# Patient Record
Sex: Male | Born: 1950
Health system: Southern US, Community
[De-identification: ages and names within clinical notes are randomized; demographics above are authoritative.]

## PROBLEM LIST (undated history)

## (undated) DIAGNOSIS — I499 Cardiac arrhythmia, unspecified: Secondary | ICD-10-CM

## (undated) DIAGNOSIS — E785 Hyperlipidemia, unspecified: Secondary | ICD-10-CM

## (undated) DIAGNOSIS — C61 Malignant neoplasm of prostate: Secondary | ICD-10-CM

## (undated) DIAGNOSIS — I10 Essential (primary) hypertension: Secondary | ICD-10-CM

## (undated) DIAGNOSIS — T7840XA Allergy, unspecified, initial encounter: Secondary | ICD-10-CM

## (undated) HISTORY — DX: Hyperlipidemia, unspecified: E78.5

## (undated) HISTORY — DX: Allergy, unspecified, initial encounter: T78.40XA

## (undated) HISTORY — DX: Essential (primary) hypertension: I10

---

## 2009-07-11 HISTORY — PX: NM MYOCAR PERF WALL MOTION: HXRAD629

## 2012-12-10 ENCOUNTER — Telehealth: Payer: Self-pay | Admitting: Internal Medicine

## 2012-12-10 MED ORDER — HYDRALAZINE HCL 50 MG PO TABS: 50.0000 mg | ORAL_TABLET | Freq: Three times a day (TID) | ORAL | Status: DC

## 2012-12-10 NOTE — Telephone Encounter (Signed)
Have not been able to get his Hydralazine 50mg #90-Call to [facility] have tried to get this-told pt to call us!Need this asap-please call today if possible-He is totally out of it-Pt wife says he must have his medicine tonight!

## 2012-12-10 NOTE — Telephone Encounter (Signed)
Refill(s) sent to pharmacy.  Call to pt and spoke w/ wife [name] and informed.  Verbalized understanding.

## 2012-12-12 ENCOUNTER — Other Ambulatory Visit: Payer: Self-pay | Admitting: Internal Medicine

## 2012-12-18 ENCOUNTER — Other Ambulatory Visit: Payer: Self-pay | Admitting: Internal Medicine

## 2013-05-01 ENCOUNTER — Other Ambulatory Visit: Payer: Self-pay | Admitting: Internal Medicine

## 2013-05-01 NOTE — Telephone Encounter (Signed)
Rx was sent to pharmacy electronically. 

## 2013-06-12 ENCOUNTER — Other Ambulatory Visit: Payer: Self-pay | Admitting: *Deleted

## 2013-06-12 MED ORDER — NEBIVOLOL HCL 10 MG PO TABS: 10.0000 mg | ORAL_TABLET | Freq: Two times a day (BID) | ORAL | Status: DC

## 2013-07-27 ENCOUNTER — Other Ambulatory Visit: Payer: Self-pay | Admitting: Internal Medicine

## 2013-07-29 ENCOUNTER — Other Ambulatory Visit: Payer: Self-pay | Admitting: *Deleted

## 2013-07-29 MED ORDER — NEBIVOLOL HCL 10 MG PO TABS: 20.0000 mg | ORAL_TABLET | Freq: Two times a day (BID) | ORAL | Status: DC

## 2013-09-28 ENCOUNTER — Other Ambulatory Visit: Payer: Self-pay | Admitting: Internal Medicine

## 2013-09-29 ENCOUNTER — Emergency Department (HOSPITAL_COMMUNITY)
Admission: EM | Admit: 2013-09-29 | Discharge: 2013-09-29 | Discharge status: Absent without leave | Payer: BC Managed Care – PPO | Source: Home / Self Care

## 2013-09-30 NOTE — Telephone Encounter (Signed)
Rx was sent to pharmacy electronically. 

## 2013-10-17 ENCOUNTER — Encounter: Payer: Self-pay | Admitting: *Deleted

## 2013-10-18 ENCOUNTER — Ambulatory Visit: Payer: BC Managed Care – PPO | Admitting: Internal Medicine

## 2013-10-28 ENCOUNTER — Other Ambulatory Visit: Payer: Self-pay | Admitting: Internal Medicine

## 2013-10-28 NOTE — Telephone Encounter (Signed)
Rx was sent to pharmacy electronically. 

## 2013-11-26 ENCOUNTER — Encounter: Payer: Self-pay | Admitting: Internal Medicine

## 2013-11-26 ENCOUNTER — Ambulatory Visit: Payer: BC Managed Care – PPO | Admitting: Internal Medicine

## 2013-12-09 ENCOUNTER — Other Ambulatory Visit: Payer: Self-pay | Admitting: Internal Medicine

## 2013-12-09 NOTE — Telephone Encounter (Signed)
Rx was sent to pharmacy electronically. Patient must keep appointment on 12/30/13 for future refills.

## 2013-12-24 ENCOUNTER — Other Ambulatory Visit: Payer: Self-pay | Admitting: Internal Medicine

## 2013-12-24 NOTE — Telephone Encounter (Signed)
Rx was sent to pharmacy electronically. Patient must keep appointment on 12/30/13 with Dr Jayme Cloud for future refills.

## 2013-12-30 ENCOUNTER — Encounter: Payer: Self-pay | Admitting: Internal Medicine

## 2013-12-30 ENCOUNTER — Ambulatory Visit (INDEPENDENT_AMBULATORY_CARE_PROVIDER_SITE_OTHER): Payer: BC Managed Care – PPO | Admitting: Internal Medicine

## 2013-12-30 VITALS — BP 174/98 | HR 71 | Ht 71.0 in | Wt 212.5 lb

## 2013-12-30 DIAGNOSIS — E785 Hyperlipidemia, unspecified: Secondary | ICD-10-CM

## 2013-12-30 DIAGNOSIS — I10 Essential (primary) hypertension: Secondary | ICD-10-CM

## 2013-12-30 NOTE — Patient Instructions (Signed)
No changes were made today in your current therapy.  Dr Debara Pickett wants you to follow-up in 1 year. You will receive a reminder letter in the mail one months in advance. If you don't receive a letter, please call our office to schedule the follow-up appointment.

## 2013-12-31 ENCOUNTER — Other Ambulatory Visit: Payer: Self-pay | Admitting: Internal Medicine

## 2013-12-31 NOTE — Telephone Encounter (Signed)
Rx was sent to pharmacy electronically. 

## 2014-01-09 ENCOUNTER — Encounter: Payer: Self-pay | Admitting: Internal Medicine

## 2014-01-09 DIAGNOSIS — I10 Essential (primary) hypertension: Secondary | ICD-10-CM | POA: Insufficient documentation

## 2014-01-09 DIAGNOSIS — E785 Hyperlipidemia, unspecified: Secondary | ICD-10-CM | POA: Insufficient documentation

## 2014-01-09 NOTE — Progress Notes (Signed)
OFFICE NOTE  Chief Complaint:  No complaints  Primary Care Physician: Trevor Pao, MD  HPI:  Trevor Trevor [name]  is a pleasant 63 year old gentleman with history of resistant hypertension, on numerous medications including clonidine, Tribenzor, hydralazine, and Bystolic. Blood pressures at home seem to be well controlled; however, he does have additional white coat syndrome. Today in the office, his blood pressure was 180/110 and did not come down after another recheck toward the end of the visit. Blood pressures at home, per a diary that he has kept, range between the 120s and 130s up to 160s, with an average blood pressure between 416 and 606 systolic and 80 to 90 diastolic. Overall, I think this is pretty good control. He is asymptomatic. He denies any chest pain, worsening shortness of breath, palpitations, presyncope, or syncopal symptoms. He tells me that he had a recent physical with you and had a lipid profile checked, which was within normal limits. Recent laboratory work was received from your office which demonstrates total cholesterol 128, triglycerides 158, HDL 40 and LDL 56.  Hemoglobin A1c was 5.6.  PMHx:  Past Medical History  Diagnosis Date  . Hypertension   . Dyslipidemia     Past Surgical History  Procedure Laterality Date  . Nm myocar perf wall motion  2011    persantine myoview - normal perfusion, EF 64%, low risk scan    FAMHx:  Family History  Problem Relation Age of Onset  . Breast cancer Mother   . Heart disease Father   . Prostate cancer Father   . Heart disease Maternal Grandmother   . Heart disease Maternal Grandfather   . Heart attack Paternal Grandmother   . Lupus Paternal Grandfather   . Heart disease Brother     46's    SOCHx:   reports that he has never smoked. He does not have any smokeless tobacco history on file. His alcohol and drug histories are not on file.  ALLERGIES:  No Known Allergies  ROS: A comprehensive review of  systems was negative.  HOME MEDS: Current Outpatient Prescriptions  Medication Sig Dispense Refill  . aspirin 81 MG tablet Take 81 mg by mouth daily.      Marland Kitchen atorvastatin (LIPITOR) 10 MG tablet Take 10 mg by mouth daily.      . cloNIDine (CATAPRES) 0.2 MG tablet take 1 tablet by mouth twice a day  60 tablet  0  . hydrALAZINE (APRESOLINE) 50 MG tablet take 1 tablet by mouth three times a day  90 tablet  0  . irbesartan-hydrochlorothiazide (AVALIDE) 300-12.5 MG per tablet take 1 tablet by mouth once daily  30 tablet  0  . Multiple Vitamin (MULTIVITAMIN) capsule Take 1 capsule by mouth daily.      . nebivolol (BYSTOLIC) 10 MG tablet Take 2 tablets (20 mg total) by mouth 2 (two) times daily.  120 tablet  3  . amLODipine (NORVASC) 10 MG tablet take 1 tablet by mouth once daily  30 tablet  11   No current facility-administered medications for this visit.    LABS/IMAGING: No results found for this or any previous visit (from the past 48 hour(s)). No results found.  VITALS: BP 174/98  Pulse 71  Ht 5\' 11"  (1.803 m)  Wt 212 lb 8 oz (96.389 kg)  BMI 29.65 kg/m2  EXAM: General appearance: alert and no distress Neck: no carotid bruit and no JVD Lungs: clear to auscultation bilaterally Heart: regular rate and rhythm, S1, S2 normal,  no murmur, click, rub or gallop Abdomen: soft, non-tender; bowel sounds normal; no masses,  no organomegaly Extremities: extremities normal, atraumatic, no cyanosis or edema Pulses: 2+ and symmetric Skin: Skin color, texture, turgor normal. No rashes or lesions Neurologic: Grossly normal Psych: Mood, affect normal  EKG: Normal sinus rhythm at 71  ASSESSMENT: 1. Hypertension-controlled 2. Dyslipidemia-at goal  PLAN: 1.   Trevor Trevor [name] is doing well. His blood pressure and cholesterol are at goal. I would not make any changes in his medications at this time. Plan to see him back annually or sooner as necessary.  Trevor Casino, MD, [facility] Attending  Cardiologist [facility]  Trevor [name],Trevor Trevor [name] 01/09/2014, 5:25 PM

## 2014-01-26 ENCOUNTER — Other Ambulatory Visit: Payer: Self-pay | Admitting: Internal Medicine

## 2014-02-05 ENCOUNTER — Other Ambulatory Visit: Payer: Self-pay | Admitting: Internal Medicine

## 2014-02-05 NOTE — Telephone Encounter (Signed)
Rx was sent to pharmacy electronically. 

## 2014-06-23 ENCOUNTER — Other Ambulatory Visit: Payer: Self-pay | Admitting: Internal Medicine

## 2014-06-23 NOTE — Telephone Encounter (Signed)
Rx was sent to pharmacy electronically. 

## 2014-08-24 ENCOUNTER — Other Ambulatory Visit: Payer: Self-pay | Admitting: Internal Medicine

## 2014-08-25 NOTE — Telephone Encounter (Signed)
Rx(s) sent to pharmacy electronically.  

## 2014-09-24 ENCOUNTER — Telehealth: Payer: Self-pay | Admitting: *Deleted

## 2014-09-24 NOTE — Telephone Encounter (Signed)
Faxed PA/quantity exception to [employer] for Bystolic 10mg  - take 2 tablets BID

## 2014-09-30 ENCOUNTER — Telehealth: Payer: Self-pay | Admitting: Internal Medicine

## 2014-09-30 ENCOUNTER — Other Ambulatory Visit: Payer: Self-pay | Admitting: *Deleted

## 2014-09-30 MED ORDER — NEBIVOLOL HCL 20 MG PO TABS: 20.0000 mg | ORAL_TABLET | Freq: Two times a day (BID) | ORAL | Status: DC

## 2014-09-30 NOTE — Telephone Encounter (Signed)
Bystolic 10mg  - take 2 tablets by mouth BID - not approved per pharmacy. They received a notification of denial on 09/25/14  Was advised to send in Rx for bystolic 20mg  - take 1 tablet BID - V/O for this strength of tablet and updated instructions given #60 with 3 refills Pharmacy staff will fax PA notification if it is generated.   LM for patient to return call to update him on situation

## 2014-10-01 NOTE — Telephone Encounter (Signed)
Close encounter 

## 2014-10-02 NOTE — Telephone Encounter (Signed)
Patient notified that Rx was changed to 20mg  tablets - take 2 BID - and this was approved. He states he got a call from pharmacy about 2 hours after he dropped off paperwork r/t PA on Tuesday that bystolic 20mg  was approved. He thanked Therapist, sports for assistance with this matter.

## 2014-10-09 ENCOUNTER — Encounter: Payer: Self-pay | Admitting: Gastroenterology

## 2014-11-05 ENCOUNTER — Other Ambulatory Visit: Payer: Self-pay

## 2014-11-05 MED ORDER — NEBIVOLOL HCL 20 MG PO TABS: 20.0000 mg | ORAL_TABLET | Freq: Two times a day (BID) | ORAL | Status: DC

## 2014-11-05 NOTE — Telephone Encounter (Signed)
Rx(s) sent to pharmacy electronically.  

## 2014-11-06 ENCOUNTER — Telehealth: Payer: Self-pay | Admitting: *Deleted

## 2014-11-06 NOTE — Telephone Encounter (Signed)
No show letter mailed to patient and colonoscopy cancelled.

## 2014-11-06 NOTE — Telephone Encounter (Signed)
Patient no show for previsit appointment 11/06/14. Message left on cell phone as well as with wife to reschedule previst appointment by 5 pm today or colonoscopy would be cancelled 11/20/14 and both appointments would need to be rescheduled.

## 2014-11-20 ENCOUNTER — Encounter: Payer: Self-pay | Admitting: Gastroenterology

## 2014-12-25 ENCOUNTER — Other Ambulatory Visit: Payer: Self-pay | Admitting: Internal Medicine

## 2014-12-25 NOTE — Telephone Encounter (Signed)
Rx(s) sent to pharmacy electronically.  

## 2015-01-05 ENCOUNTER — Ambulatory Visit (INDEPENDENT_AMBULATORY_CARE_PROVIDER_SITE_OTHER): Payer: Managed Care, Other (non HMO) | Admitting: Internal Medicine

## 2015-01-05 ENCOUNTER — Encounter: Payer: Self-pay | Admitting: Internal Medicine

## 2015-01-05 DIAGNOSIS — N390 Urinary tract infection, site not specified: Secondary | ICD-10-CM

## 2015-01-05 DIAGNOSIS — I10 Essential (primary) hypertension: Secondary | ICD-10-CM

## 2015-01-05 DIAGNOSIS — E785 Hyperlipidemia, unspecified: Secondary | ICD-10-CM

## 2015-01-05 MED ORDER — HYDRALAZINE HCL 50 MG PO TABS: 50.0000 mg | ORAL_TABLET | Freq: Three times a day (TID) | ORAL | Status: DC

## 2015-01-05 MED ORDER — CLONIDINE HCL 0.2 MG PO TABS: 0.2000 mg | ORAL_TABLET | Freq: Two times a day (BID) | ORAL | Status: DC

## 2015-01-05 MED ORDER — AMLODIPINE BESYLATE 10 MG PO TABS: 10.0000 mg | ORAL_TABLET | Freq: Every day | ORAL | Status: DC

## 2015-01-05 MED ORDER — IRBESARTAN-HYDROCHLOROTHIAZIDE 300-12.5 MG PO TABS: 1.0000 | ORAL_TABLET | Freq: Every day | ORAL | Status: DC

## 2015-01-05 NOTE — Patient Instructions (Signed)
Your physician wants you to follow-up in: 1 year with Dr. Hilty. You will receive a reminder letter in the mail two months in advance. If you don't receive a letter, please call our office to schedule the follow-up appointment.  

## 2015-01-07 DIAGNOSIS — N390 Urinary tract infection, site not specified: Secondary | ICD-10-CM | POA: Insufficient documentation

## 2015-01-07 NOTE — Progress Notes (Signed)
OFFICE NOTE  Chief Complaint:  No complaints  Primary Care Physician: Haywood Pao, MD  HPI:  Trevor [name]  is a pleasant 64 year old gentleman with history of resistant hypertension, on numerous medications including clonidine, Tribenzor, hydralazine, and Bystolic. Blood pressures at home seem to be well controlled; however, he does have additional white coat syndrome. Today in the office, his blood pressure was 180/110 and did not come down after another recheck toward the end of the visit. Blood pressures at home, per a diary that he has kept, range between the 120s and 130s up to 160s, with an average blood pressure between 371 and 696 systolic and 80 to 90 diastolic. Overall, I think this is pretty good control. He is asymptomatic. He denies any chest pain, worsening shortness of breath, palpitations, presyncope, or syncopal symptoms. He tells me that he had a recent physical with you and had a lipid profile checked, which was within normal limits. Recent laboratory work was received from your office which demonstrates total cholesterol 128, triglycerides 158, HDL 40 and LDL 56.  Hemoglobin A1c was 5.6.  Some Trevor [name] her back in the office today. He is currently without any complaints. He recently had a urinary tract infection and is on anabolic for this. He denies any chest pain or worsening shortness of breath. Blood pressure has been well controlled. He's had no side effects from his cholesterol medicines.  PMHx:  Past Medical History  Diagnosis Date  . Hypertension   . Dyslipidemia     Past Surgical History  Procedure Laterality Date  . Nm myocar perf wall motion  2011    persantine myoview - normal perfusion, EF 64%, low risk scan    FAMHx:  Family History  Problem Relation Age of Onset  . Breast cancer Mother   . Heart disease Father   . Prostate cancer Father   . Heart disease Maternal Grandmother   . Heart disease Maternal Grandfather   . Heart attack  Paternal Grandmother   . Lupus Paternal Grandfather   . Heart disease Brother     71's    SOCHx:   reports that he has never smoked. He does not have any smokeless tobacco history on file. His alcohol and drug histories are not on file.  ALLERGIES:  No Known Allergies  ROS: A comprehensive review of systems was negative.  HOME MEDS: Current Outpatient Prescriptions  Medication Sig Dispense Refill  . amLODipine (NORVASC) 10 MG tablet Take 1 tablet (10 mg total) by mouth daily. 30 tablet 11  . aspirin 81 MG tablet Take 81 mg by mouth daily.    Marland Kitchen atorvastatin (LIPITOR) 10 MG tablet Take 10 mg by mouth daily.    . cloNIDine (CATAPRES) 0.2 MG tablet Take 1 tablet (0.2 mg total) by mouth 2 (two) times daily. 60 tablet 11  . hydrALAZINE (APRESOLINE) 50 MG tablet Take 1 tablet (50 mg total) by mouth 3 (three) times daily. 90 tablet 11  . irbesartan-hydrochlorothiazide (AVALIDE) 300-12.5 MG per tablet Take 1 tablet by mouth daily. 30 tablet 11  . Multiple Vitamin (MULTIVITAMIN) capsule Take 1 capsule by mouth daily.    . Nebivolol HCl 20 MG TABS Take 1 tablet (20 mg total) by mouth 2 (two) times daily. 180 tablet 0  . Triamcinolone Acetonide (NASACORT AQ NA) Place into the nose as needed.     No current facility-administered medications for this visit.    LABS/IMAGING: No results found for this or any previous visit (  from the past 48 hour(s)). No results found.  VITALS: BP 130/84 mmHg  Pulse 67  Ht 5\' 11"  (1.803 m)  Wt 200 lb 4.8 oz (90.855 kg)  BMI 27.95 kg/m2  EXAM: General appearance: alert and no distress Neck: no carotid bruit and no JVD Lungs: clear to auscultation bilaterally Heart: regular rate and rhythm, S1, S2 normal, no murmur, click, rub or gallop Abdomen: soft, non-tender; bowel sounds normal; no masses,  no organomegaly Extremities: extremities normal, atraumatic, no cyanosis or edema Pulses: 2+ and symmetric Skin: Skin color, texture, turgor normal. No rashes  or lesions Neurologic: Grossly normal Psych: Mood, affect normal  EKG: Normal sinus rhythm at 67  ASSESSMENT: 1. Hypertension-controlled 2. Dyslipidemia-at goal 3. UTI  PLAN: 1.   Trevor [name] is doing well. His blood pressure and cholesterol are at goal. I would not make any changes in his medications at this time. He's being treated with in and about for UTI currently and reports his symptoms are resolving. Plan to see him back annually or sooner as necessary.  Pixie Casino, MD, [facility] Attending Cardiologist [place] 01/07/2015, 9:10 PM

## 2015-01-30 ENCOUNTER — Other Ambulatory Visit: Payer: Self-pay | Admitting: Internal Medicine

## 2015-01-30 MED ORDER — NEBIVOLOL HCL 20 MG PO TABS: 20.0000 mg | ORAL_TABLET | Freq: Two times a day (BID) | ORAL | Status: DC

## 2015-01-30 NOTE — Telephone Encounter (Signed)
bystolic refilled. 

## 2015-08-07 ENCOUNTER — Other Ambulatory Visit: Payer: Self-pay | Admitting: *Deleted

## 2015-08-07 MED ORDER — NEBIVOLOL HCL 20 MG PO TABS: 20.0000 mg | ORAL_TABLET | Freq: Two times a day (BID) | ORAL | Status: DC

## 2015-11-09 ENCOUNTER — Other Ambulatory Visit: Payer: Self-pay | Admitting: Internal Medicine

## 2015-12-21 ENCOUNTER — Encounter: Payer: Self-pay | Admitting: Gastroenterology

## 2016-01-01 ENCOUNTER — Other Ambulatory Visit: Payer: Self-pay | Admitting: Internal Medicine

## 2016-01-01 NOTE — Telephone Encounter (Signed)
Pt must keep follow up appointment for any future refills.

## 2016-01-05 ENCOUNTER — Other Ambulatory Visit: Payer: Self-pay | Admitting: Internal Medicine

## 2016-01-06 ENCOUNTER — Other Ambulatory Visit: Payer: Self-pay

## 2016-01-07 ENCOUNTER — Encounter: Payer: Self-pay | Admitting: Internal Medicine

## 2016-01-07 ENCOUNTER — Ambulatory Visit (INDEPENDENT_AMBULATORY_CARE_PROVIDER_SITE_OTHER): Payer: 59 | Admitting: Internal Medicine

## 2016-01-07 ENCOUNTER — Other Ambulatory Visit: Payer: Self-pay | Admitting: *Deleted

## 2016-01-07 VITALS — BP 166/106 | HR 58 | Ht 71.0 in | Wt 197.0 lb

## 2016-01-07 DIAGNOSIS — I10 Essential (primary) hypertension: Secondary | ICD-10-CM

## 2016-01-07 DIAGNOSIS — E785 Hyperlipidemia, unspecified: Secondary | ICD-10-CM | POA: Diagnosis not present

## 2016-01-07 MED ORDER — HYDRALAZINE HCL 50 MG PO TABS: 50.0000 mg | ORAL_TABLET | Freq: Two times a day (BID) | ORAL | Status: DC

## 2016-01-07 NOTE — Progress Notes (Signed)
OFFICE NOTE  Chief Complaint:  No complaints  Primary Care Physician: Haywood Pao, MD  HPI:  Trevor [name]  is a pleasant 65 year old gentleman with history of resistant hypertension, on numerous medications including clonidine, Tribenzor, hydralazine, and Bystolic. Blood pressures at home seem to be well controlled; however, he does have additional white coat syndrome. Today in the office, his blood pressure was 180/110 and did not come down after another recheck toward the end of the visit. Blood pressures at home, per a diary that he has kept, range between the 120s and 130s up to 160s, with an average blood pressure between [id number] and [id number] systolic and 80 to 90 diastolic. Overall, I think this is pretty good control. He is asymptomatic. He denies any chest pain, worsening shortness of breath, palpitations, presyncope, or syncopal symptoms. He tells me that he had a recent physical with you and had a lipid profile checked, which was within normal limits. Recent laboratory work was received from your office which demonstrates total cholesterol 128, triglycerides 158, HDL 40 and LDL 56.  Hemoglobin A1c was 5.6.  Some Trevor [name] back in the office today. He is currently without any complaints. He recently had a urinary tract infection and is on anabolic for this. He denies any chest pain or worsening shortness of breath. Blood pressure has been well controlled. He's had no side effects from his cholesterol medicines.  01/07/2016  Trevor [name] presents today for annual follow-up. Overall he seems to be doing quite well. He had a recent office visit with his primary care provider and a clean bill of health. Blood pressure has been reasonably well controlled given the number of medicines he is on. His blood pressures at home range from the mid 130s to 140s although in the office today it was elevated to 166/106. I rechecked the blood pressure at the end of the visit and was 150/80. He denies any chest  pain or shortness of breath.  PMHx:  Past Medical History  Diagnosis Date  . Hypertension   . Dyslipidemia     Past Surgical History  Procedure Laterality Date  . Nm myocar perf wall motion  2011    persantine myoview - normal perfusion, EF 64%, low risk scan    FAMHx:  Family History  Problem Relation Age of Onset  . Breast cancer Mother   . Heart disease Father   . Prostate cancer Father   . Heart disease Maternal Grandmother   . Heart disease Maternal Grandfather   . Heart attack Paternal Grandmother   . Lupus Paternal Grandfather   . Heart disease Brother     30's    SOCHx:   reports that he has never smoked. He does not have any smokeless tobacco history on file. His alcohol and drug histories are not on file.  ALLERGIES:  No Known Allergies  ROS: Pertinent items noted in HPI and remainder of comprehensive ROS otherwise negative.  HOME MEDS: Current Outpatient Prescriptions  Medication Sig Dispense Refill  . amLODipine (NORVASC) 10 MG tablet Take 1 tablet (10 mg total) by mouth daily. 30 tablet 11  . aspirin 81 MG tablet Take 81 mg by mouth daily.    Marland Kitchen atorvastatin (LIPITOR) 10 MG tablet Take 10 mg by mouth daily.    Marland Kitchen BYSTOLIC 20 MG TABS take 1 tablet by mouth twice a day 60 tablet 3  . cloNIDine (CATAPRES) 0.2 MG tablet take 1 tablet by mouth twice a day 60 tablet  0  . hydrALAZINE (APRESOLINE) 50 MG tablet Take 50 mg by mouth 2 (two) times daily.    . irbesartan-hydrochlorothiazide (AVALIDE) 300-12.5 MG per tablet Take 1 tablet by mouth daily. 30 tablet 11  . Triamcinolone Acetonide (NASACORT AQ NA) Place into the nose as needed.     No current facility-administered medications for this visit.    LABS/IMAGING: No results found for this or any previous visit (from the past 48 hour(s)). No results found.  VITALS: BP 166/106 mmHg  Pulse 58  Ht 5\' 11"  (1.803 m)  Wt 197 lb (89.359 kg)  BMI 27.49 kg/m2  EXAM: General appearance: alert and no  distress Neck: no carotid bruit and no JVD Lungs: clear to auscultation bilaterally Heart: regular rate and rhythm, S1, S2 normal, no murmur, click, rub or gallop Abdomen: soft, non-tender; bowel sounds normal; no masses,  no organomegaly Extremities: extremities normal, atraumatic, no cyanosis or edema Pulses: 2+ and symmetric Skin: Skin color, texture, turgor normal. No rashes or lesions Neurologic: Grossly normal Psych: Mood, affect normal  EKG: Sinus bradycardia 58  ASSESSMENT: 1. Hypertension-controlled 2. Dyslipidemia-at goal  PLAN: 1.   Trevor [name] is doing well. Blood pressure has been fairly well-controlled at home given the number of medicines he is on. Cholesterol was recently checked and reportedly is a co-per his primary care provider. No changes were made at this VISIT. Plan follow-up with me annually or sooner as necessary.  Trevor Casino, MD, [facility] Attending Cardiologist Opheim C Hilty 01/07/2016, 8:20 AM

## 2016-01-07 NOTE — Patient Instructions (Addendum)
Medication Instructions:  No Changes   bystolic co-pay card provided to patient  Labwork: None  Testing/Procedures: None  Follow-Up: 1 Year with Dr Debara Pickett  Any Other Special Instructions Will Be Listed Below (If Applicable).     If you need a refill on your cardiac medications before your next appointment, please call your pharmacy.

## 2016-01-07 NOTE — Telephone Encounter (Signed)
Telephone encounter opened in error.

## 2016-01-07 NOTE — Telephone Encounter (Signed)
Rx(s) sent to pharmacy electronically.  

## 2016-01-19 ENCOUNTER — Other Ambulatory Visit: Payer: Self-pay | Admitting: Internal Medicine

## 2016-02-01 ENCOUNTER — Other Ambulatory Visit: Payer: Self-pay | Admitting: Internal Medicine

## 2016-03-01 ENCOUNTER — Encounter: Payer: Self-pay | Admitting: Internal Medicine

## 2016-03-02 ENCOUNTER — Encounter: Payer: Managed Care, Other (non HMO) | Admitting: Gastroenterology

## 2016-03-03 ENCOUNTER — Other Ambulatory Visit: Payer: Self-pay | Admitting: Internal Medicine

## 2016-03-03 NOTE — Telephone Encounter (Signed)
REFILL 

## 2016-03-14 ENCOUNTER — Other Ambulatory Visit: Payer: Self-pay | Admitting: Internal Medicine

## 2016-04-01 ENCOUNTER — Ambulatory Visit: Payer: 59 | Admitting: Internal Medicine

## 2016-09-26 DIAGNOSIS — D485 Neoplasm of uncertain behavior of skin: Secondary | ICD-10-CM | POA: Diagnosis not present

## 2016-09-26 DIAGNOSIS — L821 Other seborrheic keratosis: Secondary | ICD-10-CM | POA: Diagnosis not present

## 2016-09-26 DIAGNOSIS — L570 Actinic keratosis: Secondary | ICD-10-CM | POA: Diagnosis not present

## 2016-10-17 DIAGNOSIS — M25561 Pain in right knee: Secondary | ICD-10-CM | POA: Diagnosis not present

## 2016-10-17 DIAGNOSIS — M25562 Pain in left knee: Secondary | ICD-10-CM | POA: Diagnosis not present

## 2016-11-15 DIAGNOSIS — N182 Chronic kidney disease, stage 2 (mild): Secondary | ICD-10-CM | POA: Diagnosis not present

## 2016-11-15 DIAGNOSIS — E7800 Pure hypercholesterolemia, unspecified: Secondary | ICD-10-CM | POA: Diagnosis not present

## 2016-11-15 DIAGNOSIS — R7302 Impaired glucose tolerance (oral): Secondary | ICD-10-CM | POA: Diagnosis not present

## 2016-11-15 DIAGNOSIS — Z125 Encounter for screening for malignant neoplasm of prostate: Secondary | ICD-10-CM | POA: Diagnosis not present

## 2016-11-21 DIAGNOSIS — H01024 Squamous blepharitis left upper eyelid: Secondary | ICD-10-CM | POA: Diagnosis not present

## 2016-11-21 DIAGNOSIS — H01022 Squamous blepharitis right lower eyelid: Secondary | ICD-10-CM | POA: Diagnosis not present

## 2016-11-21 DIAGNOSIS — H2513 Age-related nuclear cataract, bilateral: Secondary | ICD-10-CM | POA: Diagnosis not present

## 2016-11-21 DIAGNOSIS — H01021 Squamous blepharitis right upper eyelid: Secondary | ICD-10-CM | POA: Diagnosis not present

## 2016-11-21 DIAGNOSIS — H01025 Squamous blepharitis left lower eyelid: Secondary | ICD-10-CM | POA: Diagnosis not present

## 2016-11-21 DIAGNOSIS — H40013 Open angle with borderline findings, low risk, bilateral: Secondary | ICD-10-CM | POA: Diagnosis not present

## 2016-11-22 DIAGNOSIS — Z0000 Encounter for general adult medical examination without abnormal findings: Secondary | ICD-10-CM | POA: Diagnosis not present

## 2016-11-22 DIAGNOSIS — I129 Hypertensive chronic kidney disease with stage 1 through stage 4 chronic kidney disease, or unspecified chronic kidney disease: Secondary | ICD-10-CM | POA: Diagnosis not present

## 2016-11-22 DIAGNOSIS — Z6830 Body mass index (BMI) 30.0-30.9, adult: Secondary | ICD-10-CM | POA: Diagnosis not present

## 2016-11-22 DIAGNOSIS — R195 Other fecal abnormalities: Secondary | ICD-10-CM | POA: Diagnosis not present

## 2016-11-22 DIAGNOSIS — R808 Other proteinuria: Secondary | ICD-10-CM | POA: Diagnosis not present

## 2016-11-22 DIAGNOSIS — E668 Other obesity: Secondary | ICD-10-CM | POA: Diagnosis not present

## 2016-11-22 DIAGNOSIS — R7302 Impaired glucose tolerance (oral): Secondary | ICD-10-CM | POA: Diagnosis not present

## 2016-11-22 DIAGNOSIS — E7800 Pure hypercholesterolemia, unspecified: Secondary | ICD-10-CM | POA: Diagnosis not present

## 2016-11-22 DIAGNOSIS — Z23 Encounter for immunization: Secondary | ICD-10-CM | POA: Diagnosis not present

## 2016-11-22 DIAGNOSIS — N182 Chronic kidney disease, stage 2 (mild): Secondary | ICD-10-CM | POA: Diagnosis not present

## 2016-11-22 DIAGNOSIS — J3089 Other allergic rhinitis: Secondary | ICD-10-CM | POA: Diagnosis not present

## 2016-11-23 ENCOUNTER — Encounter: Payer: Self-pay | Admitting: Gastroenterology

## 2016-12-06 ENCOUNTER — Other Ambulatory Visit: Payer: Self-pay | Admitting: Internal Medicine

## 2016-12-27 ENCOUNTER — Ambulatory Visit (AMBULATORY_SURGERY_CENTER): Payer: Self-pay | Admitting: *Deleted

## 2016-12-27 VITALS — Ht 71.0 in | Wt 205.2 lb

## 2016-12-27 DIAGNOSIS — K921 Melena: Secondary | ICD-10-CM

## 2016-12-27 MED ORDER — NA SULFATE-K SULFATE-MG SULF 17.5-3.13-1.6 GM/177ML PO SOLN: ORAL | 0 refills | Status: DC

## 2016-12-27 NOTE — Progress Notes (Signed)
Pt denies allergies to eggs or soy products. Denies difficulty with sedation or anesthesia. Denies any diet or weight loss medications. Denies use of supplemental oxygen.  Emmi instructions refused by patient. 

## 2016-12-29 DIAGNOSIS — R300 Dysuria: Secondary | ICD-10-CM | POA: Diagnosis not present

## 2017-01-02 ENCOUNTER — Ambulatory Visit (INDEPENDENT_AMBULATORY_CARE_PROVIDER_SITE_OTHER): Payer: PPO | Admitting: Internal Medicine

## 2017-01-02 ENCOUNTER — Encounter: Payer: Self-pay | Admitting: Internal Medicine

## 2017-01-02 VITALS — BP 174/107 | HR 93 | Ht 71.0 in | Wt 206.8 lb

## 2017-01-02 DIAGNOSIS — I10 Essential (primary) hypertension: Secondary | ICD-10-CM

## 2017-01-02 DIAGNOSIS — I4891 Unspecified atrial fibrillation: Secondary | ICD-10-CM | POA: Diagnosis not present

## 2017-01-02 DIAGNOSIS — D689 Coagulation defect, unspecified: Secondary | ICD-10-CM | POA: Diagnosis not present

## 2017-01-02 DIAGNOSIS — E785 Hyperlipidemia, unspecified: Secondary | ICD-10-CM

## 2017-01-02 DIAGNOSIS — Z01812 Encounter for preprocedural laboratory examination: Secondary | ICD-10-CM | POA: Diagnosis not present

## 2017-01-02 DIAGNOSIS — R5383 Other fatigue: Secondary | ICD-10-CM | POA: Diagnosis not present

## 2017-01-02 NOTE — Patient Instructions (Addendum)
Your physician has recommended you make the following change in your medication:  START Eliquis 5mg  twice daily -- 2 boxes of samples + copay card provided to patient STOP aspirin  Your physician has recommended that you have a Cardioversion (DCCV) in [place] - schedule w/Dr. Debara Pickett. Electrical Cardioversion uses a jolt of electricity to your heart either through paddles or wired patches attached to your chest. This is a controlled, usually prescheduled, procedure. Defibrillation is done under light anesthesia in the hospital, and you usually go home the day of the procedure. This is done to get your heart back into a normal rhythm. You are not awake for the procedure. Please see the instruction sheet given to you today.  Your physician recommends that you schedule a follow-up appointment in [place] for a nurse visit EKG check -- if still in atrial fibrillation, you will have lab work done the same day (in anticipation of cardioversion)  Your physician has requested that you have an echocardiogram @ [address] - [address]rd [address]. Echocardiography is a painless test that uses sound waves to create images of your heart. It provides your doctor with information about the size and shape of your heart and how well your heart's chambers and valves are working. This procedure takes approximately one hour. There are no restrictions for this procedure.  Your physician recommends that you schedule a follow-up appointment with Dr. Debara Pickett 3-4 weeks after cardioversion

## 2017-01-02 NOTE — Progress Notes (Signed)
OFFICE NOTE  Chief Complaint:  No complaints  Primary Care Physician: Tisovec, [name], MD  HPI:  Trevor [name]  is a pleasant 66 year old gentleman with history of resistant hypertension, on numerous medications including clonidine, Tribenzor, hydralazine, and Bystolic. Blood pressures at home seem to be well controlled; however, he does have additional white coat syndrome. Today in the office, his blood pressure was 180/110 and did not come down after another recheck toward the end of the visit. Blood pressures at home, per a diary that he has kept, range between the 120s and 130s up to 160s, with an average blood pressure between 710 and 626 systolic and 80 to 90 diastolic. Overall, I think this is pretty good control. He is asymptomatic. He denies any chest pain, worsening shortness of breath, palpitations, presyncope, or syncopal symptoms. He tells me that he had a recent physical with you and had a lipid profile checked, which was within normal limits. Recent laboratory work was received from your office which demonstrates total cholesterol 128, triglycerides 158, HDL 40 and LDL 56.  Hemoglobin A1c was 5.6.  Some Trevor [name] back in the office today. He is currently without any complaints. He recently had a urinary tract infection and is on anabolic for this. He denies any chest pain or worsening shortness of breath. Blood pressure has been well controlled. He's had no side effects from his cholesterol medicines.  01/07/2016  Trevor [name] presents today for annual follow-up. Overall he seems to be doing quite well. He had a recent office visit with his primary care provider and a clean bill of health. Blood pressure has been reasonably well controlled given the number of medicines he is on. His blood pressures at home range from the mid 130s to 140s although in the office today it was elevated to 166/106. I rechecked the blood pressure at the end of the visit and was 150/80. He denies any  chest pain or shortness of breath.  01/02/2017  I saw Trevor [name] today in follow-up. He reports continuing to feel well. He denies any chest pain or worsening shortness of breath. Occasionally notices heart rate is elevated. He's had no worsening fatigue and continues to work outside in [employer] job. Blood pressure is elevated 174/107 however came down on recheck. His blood pressures at home were reviewed personally indicating good control between the 948N and 462 systolic and diastolics in the 70J. He is on a number of medications for this. A routine EKG today was performed which indicated an irregularly irregular rhythm that was clearly atrial fibrillation. He has no history of atrial fibrillation and this is a new diagnosis. He denies any chest pain.   PMHx:  Past Medical History:  Diagnosis Date  . Allergy    seasonal  . Dyslipidemia   . Hyperlipidemia    preventitive due to brothers heart issues in his  66's  . Hypertension     Past Surgical History:  Procedure Laterality Date  . NM MYOCAR PERF WALL MOTION  2011   persantine myoview - normal perfusion, EF 64%, low risk scan    FAMHx:  Family History  Problem Relation Age of Onset  . Breast cancer Mother   . Heart disease Father   . Prostate cancer Father   . Kidney disease Father   . Heart disease Maternal Grandmother   . Heart disease Maternal Grandfather   . Heart attack Paternal Grandmother   . Lupus Paternal Grandfather   . Heart  disease Brother        73's  . Colon cancer Neg Hx     SOCHx:   reports that he has never smoked. He has quit using smokeless tobacco. He reports that he does not drink alcohol or use drugs.  ALLERGIES:  No Known Allergies  ROS: Pertinent items noted in HPI and remainder of comprehensive ROS otherwise negative.  HOME MEDS: Current Outpatient Prescriptions  Medication Sig Dispense Refill  . amLODipine (NORVASC) 10 MG tablet take 1 tablet by mouth once daily 30 tablet 11  .  aspirin 81 MG tablet Take 81 mg by mouth daily.    Marland Kitchen atorvastatin (LIPITOR) 10 MG tablet Take 10 mg by mouth daily.    Marland Kitchen BYSTOLIC 20 MG TABS take 1 tablet by mouth twice a day 60 tablet 9  . cloNIDine (CATAPRES) 0.2 MG tablet take 1 tablet by mouth twice a day 60 tablet 11  . fluticasone (FLONASE) 50 MCG/ACT nasal spray Place 1 spray into both nostrils daily.    . hydrALAZINE (APRESOLINE) 50 MG tablet Take 50 mg by mouth 3 (three) times daily.    . irbesartan-hydrochlorothiazide (AVALIDE) 300-12.5 MG tablet take 1 tablet by mouth once daily 30 tablet 11  . tamsulosin (FLOMAX) 0.4 MG CAPS capsule Take 0.4 mg by mouth daily.     No current facility-administered medications for this visit.     LABS/IMAGING: No results found for this or any previous visit (from the past 48 hour(s)). No results found.  VITALS: BP (!) 174/107   Pulse 93   Ht 5\' 11"  (1.803 m)   Wt 206 lb 12.8 oz (93.8 kg)   BMI 28.84 kg/m   EXAM: General appearance: alert and no distress Neck: no carotid bruit and no JVD Lungs: clear to auscultation bilaterally Heart: irregularly irregular rhythm and S1, S2 normal Abdomen: soft, non-tender; bowel sounds normal; no masses,  no organomegaly Extremities: extremities normal, atraumatic, no cyanosis or edema Pulses: 2+ and symmetric Skin: Skin color, texture, turgor normal. No rashes or lesions Neurologic: Grossly normal Psych: Pleasant  EKG: Atrial fibrillation at 93  ASSESSMENT: 1. New onset atrial fibrillation - CHADSVASC 2 2. Hypertension-controlled 3. Dyslipidemia  PLAN: 1.   Trevor [name] continues to be asymptomatic however when pressed about atrial fibrillation he reports occasionally notes his heart rate races a little. Today blood pressure was elevated however it typically is during the office visits and his home blood pressures indicate good control. EKG however demonstrates A. Fib. This patients CHA2DS2-VASc Score and unadjusted Ischemic Stroke Rate (% per  year) is equal to 2.2 % stroke rate/year from a score of 2. Based on this some recommending anticoagulation. I would recommend starting Eliquis 5 mg twice a day. We can discontinue his aspirin. I like to get an echocardiogram to look for any structural changes that may explain his new onset A. fib as well as to look at left atrial size. Although he reports pain fairly asymptomatic and think would be reasonable to consider at least a one-time effort to get him back into sinus rhythm. We will plan 3 weeks minimum of anticoagulation and schedule of nursing follow-up visit to check an EKG to see if he still in persistent A. fib. If this is the case we'll schedule him for a cardioversion. Plan to continue medications for hypertension. Will obtain labs for recent creatinine to make sure we don't need to do dose adjust his Eliquis and follow-up on his lipid profile.  Pixie Casino,  MD, [facility] Attending Cardiologist [place] 01/02/2017, 10:25 AM

## 2017-01-04 ENCOUNTER — Ambulatory Visit (HOSPITAL_COMMUNITY): Payer: PPO | Attending: Cardiovascular Disease

## 2017-01-04 ENCOUNTER — Other Ambulatory Visit: Payer: Self-pay

## 2017-01-04 ENCOUNTER — Other Ambulatory Visit: Payer: Self-pay | Admitting: Internal Medicine

## 2017-01-04 DIAGNOSIS — I119 Hypertensive heart disease without heart failure: Secondary | ICD-10-CM | POA: Diagnosis not present

## 2017-01-04 DIAGNOSIS — I4891 Unspecified atrial fibrillation: Secondary | ICD-10-CM

## 2017-01-04 DIAGNOSIS — E785 Hyperlipidemia, unspecified: Secondary | ICD-10-CM | POA: Diagnosis not present

## 2017-01-04 LAB — ECHOCARDIOGRAM COMPLETE
AOASC: 33 cm
CHL CUP MV DEC (S): 194
EWDT: 194 ms
FS: 26 % — AB (ref 28–44)
IVS/LV PW RATIO, ED: 1.18
LA ID, A-P, ES: 44 mm
LA diam index: 2.06 cm/m2
LA vol A4C: 80 ml
LA vol index: 37.9 mL/m2
LAVOL: 81 mL
LEFT ATRIUM END SYS DIAM: 44 mm
LVOT VTI: 21.5 cm
LVOT area: 3.14 cm2
LVOT peak grad rest: 6 mmHg
LVOT peak vel: 123 cm/s
LVOTD: 20 mm
LVOTSV: 68 mL
MV Peak grad: 7 mmHg
MV pk E vel: 128 m/s
PW: 12.9 mm — AB (ref 0.6–1.1)

## 2017-01-09 ENCOUNTER — Other Ambulatory Visit: Payer: Self-pay | Admitting: Internal Medicine

## 2017-01-12 ENCOUNTER — Other Ambulatory Visit: Payer: Self-pay | Admitting: *Deleted

## 2017-01-12 ENCOUNTER — Telehealth: Payer: Self-pay | Admitting: Pharmacist

## 2017-01-12 MED ORDER — APIXABAN 5 MG PO TABS: 5.0000 mg | ORAL_TABLET | Freq: Two times a day (BID) | ORAL | 0 refills | Status: DC

## 2017-01-12 NOTE — Telephone Encounter (Signed)
Patient inquired about EKG appointment prior to cardioversion on 02/01/2017.  He completed ECHO but no EKG done yet.   Please call him back  To clarify

## 2017-01-12 NOTE — Telephone Encounter (Signed)
Patient needs EKG check about 1 week prior to cardioversion, which is scheduled for July 25. Please call to arrange. Thanks!

## 2017-01-12 NOTE — Telephone Encounter (Signed)
Patient left a msg on the refill vm requesting an rx for eliquis be sent to [facility] on [address]. He can be reached at [phone]. Thanks, MI

## 2017-01-15 ENCOUNTER — Other Ambulatory Visit: Payer: Self-pay | Admitting: Internal Medicine

## 2017-01-16 NOTE — Telephone Encounter (Signed)
Staff message sent to [facility] administrative/scheduling pool

## 2017-01-18 ENCOUNTER — Other Ambulatory Visit: Payer: Self-pay

## 2017-01-18 NOTE — Patient Outreach (Signed)
Health Team advantage questionnaire screening call: Placed call to patient and explained reason for call. Patient confirmed identity.  Patient reports that he is doing well. Reports recent new onset of atrial fibrillation. States that he is planning to cardioversion on July 25th.  States that he has no symptom of rapid heart rate. Reviewed with patient when to call 911.  Reviewed symptoms to include chest pain, dizziness, passing out and shortness of breath.  Patient reports that he is taking eliquis. Reports close follow up with cardiology.   Patient reports that he is keeping his weight down and is exercising.   Reports no needs at this time.   Offered to send successful outreach letter and magnet. Patient agrees to mailing. Confirmed address.  No other needs identified. Tomasa Rand, RN, BSN, CEN [facility] [employer] [phone]

## 2017-01-18 NOTE — Telephone Encounter (Signed)
Nurse visit EKG check scheduled for January 24, 2017

## 2017-01-24 ENCOUNTER — Ambulatory Visit: Payer: PPO

## 2017-01-24 ENCOUNTER — Encounter: Payer: Self-pay | Admitting: Gastroenterology

## 2017-01-25 ENCOUNTER — Ambulatory Visit (INDEPENDENT_AMBULATORY_CARE_PROVIDER_SITE_OTHER): Payer: PPO | Admitting: *Deleted

## 2017-01-25 ENCOUNTER — Other Ambulatory Visit: Payer: Self-pay | Admitting: *Deleted

## 2017-01-25 DIAGNOSIS — I4891 Unspecified atrial fibrillation: Secondary | ICD-10-CM

## 2017-01-25 NOTE — Progress Notes (Signed)
Pt present for EKG pre cardioversion. Per  Dr. Martinique, patient is still in Afib and needs to continue with his cardioversion as planned next week. Pt understands.

## 2017-02-01 ENCOUNTER — Ambulatory Visit (HOSPITAL_COMMUNITY)
Admission: RE | Admit: 2017-02-01 | Discharge: 2017-02-01 | Disposition: A | Discharge status: Home / Self Care | Payer: PPO | Source: Ambulatory Visit | Attending: Internal Medicine | Admitting: Internal Medicine

## 2017-02-01 ENCOUNTER — Encounter (HOSPITAL_COMMUNITY)
Admission: RE | Disposition: A | Discharge status: Home / Self Care | Payer: Self-pay | Source: Ambulatory Visit | Attending: Internal Medicine

## 2017-02-01 ENCOUNTER — Ambulatory Visit (HOSPITAL_COMMUNITY): Payer: PPO | Admitting: Certified Registered"

## 2017-02-01 ENCOUNTER — Encounter (HOSPITAL_COMMUNITY): Payer: Self-pay | Admitting: *Deleted

## 2017-02-01 DIAGNOSIS — Z8249 Family history of ischemic heart disease and other diseases of the circulatory system: Secondary | ICD-10-CM | POA: Diagnosis not present

## 2017-02-01 DIAGNOSIS — Z7982 Long term (current) use of aspirin: Secondary | ICD-10-CM | POA: Diagnosis not present

## 2017-02-01 DIAGNOSIS — I4891 Unspecified atrial fibrillation: Secondary | ICD-10-CM | POA: Diagnosis not present

## 2017-02-01 DIAGNOSIS — Z7951 Long term (current) use of inhaled steroids: Secondary | ICD-10-CM | POA: Insufficient documentation

## 2017-02-01 DIAGNOSIS — I10 Essential (primary) hypertension: Secondary | ICD-10-CM | POA: Diagnosis not present

## 2017-02-01 DIAGNOSIS — N390 Urinary tract infection, site not specified: Secondary | ICD-10-CM | POA: Diagnosis not present

## 2017-02-01 DIAGNOSIS — E785 Hyperlipidemia, unspecified: Secondary | ICD-10-CM | POA: Insufficient documentation

## 2017-02-01 DIAGNOSIS — Z87891 Personal history of nicotine dependence: Secondary | ICD-10-CM | POA: Diagnosis not present

## 2017-02-01 HISTORY — PX: CARDIOVERSION: SHX1299

## 2017-02-01 LAB — POCT I-STAT, CHEM 8
BUN: 23 mg/dL — AB (ref 6–20)
CREATININE: 0.8 mg/dL (ref 0.61–1.24)
Calcium, Ion: 1.05 mmol/L — ABNORMAL LOW (ref 1.15–1.40)
Chloride: 108 mmol/L (ref 101–111)
GLUCOSE: 115 mg/dL — AB (ref 65–99)
HCT: 50 % (ref 39.0–52.0)
HEMOGLOBIN: 17 g/dL (ref 13.0–17.0)
Potassium: 3.5 mmol/L (ref 3.5–5.1)
Sodium: 145 mmol/L (ref 135–145)
TCO2: 26 mmol/L (ref 0–100)

## 2017-02-01 SURGERY — CARDIOVERSION
Anesthesia: General

## 2017-02-01 MED ORDER — LIDOCAINE HCL (CARDIAC) 20 MG/ML IV SOLN
INTRAVENOUS | Status: DC | PRN
  Administered 2017-02-01: 60 mg via INTRAVENOUS

## 2017-02-01 MED ORDER — SODIUM CHLORIDE 0.9 % IV SOLN
INTRAVENOUS | Status: DC
  Administered 2017-02-01 (×2): via INTRAVENOUS

## 2017-02-01 MED ORDER — PROPOFOL 10 MG/ML IV BOLUS
INTRAVENOUS | Status: DC | PRN
  Administered 2017-02-01: 70 mg via INTRAVENOUS

## 2017-02-01 NOTE — H&P (Signed)
   INTERVAL PROCEDURE H&P  History and Physical Interval Note:  02/01/2017 11:26 AM  Trevor [name] has presented today for their planned procedure. The various methods of treatment have been discussed with the patient and family. After consideration of risks, benefits and other options for treatment, the patient has consented to the procedure.  The patients' outpatient history has been reviewed, patient examined, and no change in status from most recent office note within the past 30 days (01/02/2017). I have reviewed the patients' chart and labs and will proceed as planned. Questions were answered to the patient's satisfaction.   Pixie Casino, MD, [place]  Attending Cardiologist  Direct Dial: [phone]  Fax: [phone]  Website:  [email or web address] 02/01/2017, 11:26 AM

## 2017-02-01 NOTE — Anesthesia Preprocedure Evaluation (Signed)
Anesthesia Evaluation  Patient identified by MRN, date of birth, ID band Patient awake    Reviewed: NPO status , Patient's Chart, lab work & pertinent test results, reviewed documented beta blocker date and time   Airway Mallampati: I       Dental no notable dental hx. (+) Teeth Intact   Pulmonary neg pulmonary ROS,    Pulmonary exam normal        Cardiovascular hypertension, Pt. on medications and Pt. on home beta blockers Normal cardiovascular exam Rhythm:Regular Rate:Normal     Neuro/Psych negative neurological ROS  negative psych ROS   GI/Hepatic negative GI ROS, Neg liver ROS,   Endo/Other  negative endocrine ROS  Renal/GU negative Renal ROS  negative genitourinary   Musculoskeletal negative musculoskeletal ROS (+)   Abdominal Normal abdominal exam  (+) - obese,   Peds  Hematology negative hematology ROS (+)   Anesthesia Other Findings   Reproductive/Obstetrics                             Anesthesia Physical Anesthesia Plan  ASA: II  Anesthesia Plan: MAC   Post-op Pain Management:    Induction:   PONV Risk Score and Plan: 1 and Ondansetron and Dexamethasone  Airway Management Planned: Natural Airway and Simple Face Mask  Additional Equipment:   Intra-op Plan:   Post-operative Plan:   Informed Consent: I have reviewed the patients History and Physical, chart, labs and discussed the procedure including the risks, benefits and alternatives for the proposed anesthesia with the patient or authorized representative who has indicated his/her understanding and acceptance.     Plan Discussed with: CRNA and Surgeon  Anesthesia Plan Comments:         Anesthesia Quick Evaluation

## 2017-02-01 NOTE — CV Procedure (Signed)
   CARDIOVERSION NOTE  Procedure: Electrical Cardioversion Indications:  Atrial Fibrillation  Procedure Details:  Consent: Risks of procedure as well as the alternatives and risks of each were explained to the (patient/caregiver).  Consent for procedure obtained.  Time Out: Verified patient identification, verified procedure, site/side was marked, verified correct patient position, special equipment/implants available, medications/allergies/relevent history reviewed, required imaging and test results available.  Performed  Patient placed on cardiac monitor, pulse oximetry, supplemental oxygen as necessary.  Sedation given: Propofol per anesthesia Pacer pads placed anterior and posterior chest.  Cardioverted 1 time(s).  Cardioverted at 150J biphasic.  Impression: Findings: Post procedure EKG shows: NSR Complications: None Patient did tolerate procedure well.  Plan: 1. Successful DCCV to NSR with a single 150J biphasic shock.  Time Spent Directly with the Patient:  30 minutes   Pixie Casino, MD, [place]  Attending Cardiologist  Direct Dial: [phone]  Fax: [phone]  Website:  [email or web address] 02/01/2017, 1:09 PM

## 2017-02-01 NOTE — Transfer of Care (Signed)
Immediate Anesthesia Transfer of Care Note  Patient: Trevor [name]  Procedure(s) Performed: Procedure(s): CARDIOVERSION (N/A)  Patient Location: Endoscopy Unit  Anesthesia Type:General  Level of Consciousness: awake, alert  and oriented  Airway & Oxygen Therapy: Patient Spontanous Breathing and Patient connected to nasal cannula oxygen  Post-op Assessment: Report given to RN and Post -op Vital signs reviewed and stable  Post vital signs: Reviewed and stable  Last Vitals:  Vitals:   02/01/17 1304 02/01/17 1305  BP: 120/78   Pulse: 70 73  Resp: 16 18  Temp:      Last Pain:  Vitals:   02/01/17 1135  TempSrc: Oral         Complications: No apparent anesthesia complications

## 2017-02-01 NOTE — Anesthesia Postprocedure Evaluation (Signed)
Anesthesia Post Note  Patient: Trevor [name]  Procedure(s) Performed: Procedure(s) (LRB): CARDIOVERSION (N/A)     Patient location during evaluation: PACU Anesthesia Type: General Level of consciousness: awake Pain management: pain level controlled Vital Signs Assessment: post-procedure vital signs reviewed and stable Respiratory status: spontaneous breathing Cardiovascular status: stable Postop Assessment: no signs of nausea or vomiting Anesthetic complications: no    Last Vitals:  Vitals:   02/01/17 1310 02/01/17 1315  BP: (!) 143/86 138/86  Pulse: 71 70  Resp: 11 14  Temp:      Last Pain:  Vitals:   02/01/17 1135  TempSrc: Oral   Pain Goal:                 Jany Buckwalter JR,JOHN Shunna Mikaelian

## 2017-02-02 ENCOUNTER — Encounter (HOSPITAL_COMMUNITY): Payer: Self-pay | Admitting: Internal Medicine

## 2017-02-13 ENCOUNTER — Other Ambulatory Visit: Payer: Self-pay | Admitting: Internal Medicine

## 2017-02-15 ENCOUNTER — Ambulatory Visit: Payer: PPO | Admitting: Internal Medicine

## 2017-03-06 ENCOUNTER — Encounter: Payer: Self-pay | Admitting: Internal Medicine

## 2017-03-06 ENCOUNTER — Ambulatory Visit: Payer: PPO | Admitting: Internal Medicine

## 2017-03-06 ENCOUNTER — Ambulatory Visit (INDEPENDENT_AMBULATORY_CARE_PROVIDER_SITE_OTHER): Payer: PPO | Admitting: Internal Medicine

## 2017-03-06 VITALS — BP 210/112 | HR 81 | Ht 71.0 in | Wt 215.0 lb

## 2017-03-06 DIAGNOSIS — R5383 Other fatigue: Secondary | ICD-10-CM

## 2017-03-06 DIAGNOSIS — I4891 Unspecified atrial fibrillation: Secondary | ICD-10-CM | POA: Diagnosis not present

## 2017-03-06 DIAGNOSIS — I10 Essential (primary) hypertension: Secondary | ICD-10-CM

## 2017-03-06 DIAGNOSIS — E785 Hyperlipidemia, unspecified: Secondary | ICD-10-CM | POA: Diagnosis not present

## 2017-03-06 MED ORDER — HYDRALAZINE HCL 50 MG PO TABS: 50.0000 mg | ORAL_TABLET | Freq: Two times a day (BID) | ORAL | 3 refills | Status: DC

## 2017-03-06 NOTE — Progress Notes (Signed)
OFFICE NOTE  Chief Complaint:  Follow-up cardioversion  Primary Care Physician: Tisovec, [name], MD  HPI:  Trevor [name]  is a pleasant 66 year old gentleman with history of resistant hypertension, on numerous medications including clonidine, Tribenzor, hydralazine, and Bystolic. Blood pressures at home seem to be well controlled; however, he does have additional white coat syndrome. Today in the office, his blood pressure was 180/110 and did not come down after another recheck toward the end of the visit. Blood pressures at home, per a diary that he has kept, range between the 120s and 130s up to 160s, with an average blood pressure between 673 and 419 systolic and 80 to 90 diastolic. Overall, I think this is pretty good control. He is asymptomatic. He denies any chest pain, worsening shortness of breath, palpitations, presyncope, or syncopal symptoms. He tells me that he had a recent physical with you and had a lipid profile checked, which was within normal limits. Recent laboratory work was received from your office which demonstrates total cholesterol 128, triglycerides 158, HDL 40 and LDL 56.  Hemoglobin A1c was 5.6.  Some Trevor [name] back in the office today. He is currently without any complaints. He recently had a urinary tract infection and is on anabolic for this. He denies any chest pain or worsening shortness of breath. Blood pressure has been well controlled. He's had no side effects from his cholesterol medicines.  01/07/2016  Trevor [name] presents today for annual follow-up. Overall he seems to be doing quite well. He had a recent office visit with his primary care provider and a clean bill of health. Blood pressure has been reasonably well controlled given the number of medicines he is on. His blood pressures at home range from the mid 130s to 140s although in the office today it was elevated to 166/106. I rechecked the blood pressure at the end of the visit and was 150/80. He  denies any chest pain or shortness of breath.  01/02/2017  I saw Trevor [name] today in follow-up. He reports continuing to feel well. He denies any chest pain or worsening shortness of breath. Occasionally notices heart rate is elevated. He's had no worsening fatigue and continues to work outside in [employer] job. Blood pressure is elevated 174/107 however came down on recheck. His blood pressures at home were reviewed personally indicating good control between the 379K and 240 systolic and diastolics in the 97D. He is on a number of medications for this. A routine EKG today was performed which indicated an irregularly irregular rhythm that was clearly atrial fibrillation. He has no history of atrial fibrillation and this is a new diagnosis. He denies any chest pain.   03/06/2017  Trevor [name] returns today for follow-up. He underwent successful cardioversion by myself on 02/01/2017. Since then he's maintained sinus rhythm. He reports some improvement in his fatigue. Blood pressure remains elevated today and was 210/112. He reports compliance with his medication. He is now on 5 different blood pressure medications including clonidine twice daily. He denies any end organ side effects such as chest pain, shortness of breath, visual changes or decreased urination.  PMHx:  Past Medical History:  Diagnosis Date  . Allergy    seasonal  . Dyslipidemia   . Hyperlipidemia    preventitive due to brothers heart issues in his  73's  . Hypertension     Past Surgical History:  Procedure Laterality Date  . CARDIOVERSION N/A 02/01/2017   Procedure: CARDIOVERSION;  Surgeon: Debara Pickett,  Nadean Corwin, MD;  Location: [facility] ENDOSCOPY;  Service: Cardiovascular;  Laterality: N/A;  . NM MYOCAR PERF WALL MOTION  2011   persantine myoview - normal perfusion, EF 64%, low risk scan    FAMHx:  Family History  Problem Relation Age of Onset  . Breast cancer Mother   . Heart disease Father   . Prostate cancer Father   . Kidney  disease Father   . Heart disease Maternal Grandmother   . Heart disease Maternal Grandfather   . Heart attack Paternal Grandmother   . Lupus Paternal Grandfather   . Heart disease Brother        40's  . Colon cancer Neg Hx     SOCHx:   reports that he has never smoked. He has quit using smokeless tobacco. He reports that he does not drink alcohol or use drugs.  ALLERGIES:  No Known Allergies  ROS: Pertinent items noted in HPI and remainder of comprehensive ROS otherwise negative.  HOME MEDS: Current Outpatient Prescriptions  Medication Sig Dispense Refill  . amLODipine (NORVASC) 10 MG tablet take 1 tablet by mouth once daily 30 tablet 11  . atorvastatin (LIPITOR) 10 MG tablet Take 10 mg by mouth daily.    Marland Kitchen BYSTOLIC 20 MG TABS take 1 tablet by mouth twice a day 60 tablet 2  . cloNIDine (CATAPRES) 0.2 MG tablet take 1 tablet by mouth twice a day 60 tablet 11  . ELIQUIS 5 MG TABS tablet take 1 tablet by mouth twice a day 180 tablet 1  . fluticasone (FLONASE) 50 MCG/ACT nasal spray Place 1 spray into both nostrils daily.    . hydrALAZINE (APRESOLINE) 50 MG tablet Take 1 tablet (50 mg total) by mouth 2 (two) times daily. 90 tablet 3  . irbesartan-hydrochlorothiazide (AVALIDE) 300-12.5 MG tablet take 1 tablet by mouth once daily 30 tablet 11  . tamsulosin (FLOMAX) 0.4 MG CAPS capsule Take 0.4 mg by mouth daily.     No current facility-administered medications for this visit.     LABS/IMAGING: No results found for this or any previous visit (from the past 48 hour(s)). No results found.  VITALS: BP (!) 210/112   Pulse 81   Ht 5\' 11"  (1.803 m)   Wt 215 lb (97.5 kg)   BMI 29.99 kg/m   EXAM: Deferred  EKG: Sinus rhythm at 81  ASSESSMENT: 1. New onset atrial fibrillation - CHADSVASC 2 2. Hypertension 3. Dyslipidemia  PLAN: 1.   Trevor [name] has successful cardioversion and maintains an Eliquis for A. fib with a CHADSVASC score 2. His blood pressure is not well controlled.  I've advised increasing his hydralazine to 50 mg 3 times a day. Will schedule a follow-up in the hypertension clinic in a few weeks. He may need workup for secondary causes of hypertension such as renal Dopplers and perhaps urine metanephrines, although there is not a cyclical component to his elevated blood pressure. There may be a significant psychosocial component and he reports pain under family stress which he did not elaborate.  Follow-up with me in 3 months  Pixie Casino, MD, [facility] Attending Cardiologist [place] 03/06/2017, 3:58 PM

## 2017-03-06 NOTE — Patient Instructions (Addendum)
Your physician has recommended you make the following change in your medication: INCREASE hydralazine to 50mg  three times daily  Dr. Debara Pickett has requested that you schedule an appointment with one of our clinical pharmacists for a blood pressure check appointment within the next 2-3 weeks.  -- if you monitor your blood pressure (BP) at home, please bring your BP cuff and your BP readings with you to this appointment -- please check your BP no more than twice daily, after you have been sitting/resting for 5-10 minutes, at least 1 hour after taking your BP medications  Your physician recommends that you schedule a follow-up appointment in: [place] with Dr. Debara Pickett.

## 2017-03-22 ENCOUNTER — Other Ambulatory Visit: Payer: Self-pay | Admitting: Internal Medicine

## 2017-03-28 ENCOUNTER — Ambulatory Visit: Payer: PPO

## 2017-04-10 ENCOUNTER — Other Ambulatory Visit: Payer: Self-pay | Admitting: Internal Medicine

## 2017-04-17 DIAGNOSIS — M25561 Pain in right knee: Secondary | ICD-10-CM | POA: Diagnosis not present

## 2017-05-10 ENCOUNTER — Telehealth: Payer: Self-pay | Admitting: Internal Medicine

## 2017-05-10 NOTE — Telephone Encounter (Signed)
Pt would like to know if OK to take an Advele with ELIQUIS? Please give him a call back.

## 2017-05-10 NOTE — Telephone Encounter (Signed)
The patient called asking if he could take Advil with Eliquis. He was informed to not take any NSAIDs and that the best option would be Tylenol or a pain cream. He verbalized his understanding.

## 2017-05-15 DIAGNOSIS — C44319 Basal cell carcinoma of skin of other parts of face: Secondary | ICD-10-CM | POA: Diagnosis not present

## 2017-05-15 DIAGNOSIS — L718 Other rosacea: Secondary | ICD-10-CM | POA: Diagnosis not present

## 2017-05-15 DIAGNOSIS — L570 Actinic keratosis: Secondary | ICD-10-CM | POA: Diagnosis not present

## 2017-05-15 DIAGNOSIS — D485 Neoplasm of uncertain behavior of skin: Secondary | ICD-10-CM | POA: Diagnosis not present

## 2017-06-06 ENCOUNTER — Ambulatory Visit: Payer: PPO | Admitting: Internal Medicine

## 2017-06-06 DIAGNOSIS — C44319 Basal cell carcinoma of skin of other parts of face: Secondary | ICD-10-CM | POA: Diagnosis not present

## 2017-06-22 ENCOUNTER — Ambulatory Visit: Payer: PPO | Admitting: Internal Medicine

## 2017-07-09 ENCOUNTER — Other Ambulatory Visit: Payer: Self-pay | Admitting: Internal Medicine

## 2017-08-09 ENCOUNTER — Other Ambulatory Visit: Payer: Self-pay | Admitting: Internal Medicine

## 2017-08-09 NOTE — Telephone Encounter (Signed)
°*  STAT* If patient is at the pharmacy, call can be transferred to refill team.   1. Which medications need to be refilled? (please list name of each medication and dose if known)Pharmacist said pt needs this today-going out of town-Eliquis  2. Which pharmacy/location (including street and city if local pharmacy) is medication to be sent to?[facility] [phone]  3. Do they need a 30 day or 90 day supply? 60 and refills

## 2017-08-22 ENCOUNTER — Other Ambulatory Visit: Payer: Self-pay | Admitting: Internal Medicine

## 2017-11-16 DIAGNOSIS — R972 Elevated prostate specific antigen [PSA]: Secondary | ICD-10-CM | POA: Diagnosis not present

## 2017-11-16 DIAGNOSIS — R82998 Other abnormal findings in urine: Secondary | ICD-10-CM | POA: Diagnosis not present

## 2017-11-16 DIAGNOSIS — I10 Essential (primary) hypertension: Secondary | ICD-10-CM | POA: Diagnosis not present

## 2017-11-16 DIAGNOSIS — E7800 Pure hypercholesterolemia, unspecified: Secondary | ICD-10-CM | POA: Diagnosis not present

## 2017-11-16 DIAGNOSIS — Z125 Encounter for screening for malignant neoplasm of prostate: Secondary | ICD-10-CM | POA: Diagnosis not present

## 2017-11-16 DIAGNOSIS — R7302 Impaired glucose tolerance (oral): Secondary | ICD-10-CM | POA: Diagnosis not present

## 2017-11-27 DIAGNOSIS — Z1389 Encounter for screening for other disorder: Secondary | ICD-10-CM | POA: Diagnosis not present

## 2017-11-27 DIAGNOSIS — R972 Elevated prostate specific antigen [PSA]: Secondary | ICD-10-CM | POA: Diagnosis not present

## 2017-11-27 DIAGNOSIS — I10 Essential (primary) hypertension: Secondary | ICD-10-CM | POA: Diagnosis not present

## 2017-11-27 DIAGNOSIS — I480 Paroxysmal atrial fibrillation: Secondary | ICD-10-CM | POA: Diagnosis not present

## 2017-11-27 DIAGNOSIS — Z0000 Encounter for general adult medical examination without abnormal findings: Secondary | ICD-10-CM | POA: Diagnosis not present

## 2017-11-27 DIAGNOSIS — Z6831 Body mass index (BMI) 31.0-31.9, adult: Secondary | ICD-10-CM | POA: Diagnosis not present

## 2017-11-27 DIAGNOSIS — E7800 Pure hypercholesterolemia, unspecified: Secondary | ICD-10-CM | POA: Diagnosis not present

## 2017-11-27 DIAGNOSIS — N182 Chronic kidney disease, stage 2 (mild): Secondary | ICD-10-CM | POA: Diagnosis not present

## 2017-11-27 DIAGNOSIS — R808 Other proteinuria: Secondary | ICD-10-CM | POA: Diagnosis not present

## 2017-11-27 DIAGNOSIS — Z7901 Long term (current) use of anticoagulants: Secondary | ICD-10-CM | POA: Diagnosis not present

## 2017-11-27 DIAGNOSIS — I129 Hypertensive chronic kidney disease with stage 1 through stage 4 chronic kidney disease, or unspecified chronic kidney disease: Secondary | ICD-10-CM | POA: Diagnosis not present

## 2017-11-27 DIAGNOSIS — R7302 Impaired glucose tolerance (oral): Secondary | ICD-10-CM | POA: Diagnosis not present

## 2017-12-01 DIAGNOSIS — Z1212 Encounter for screening for malignant neoplasm of rectum: Secondary | ICD-10-CM | POA: Diagnosis not present

## 2017-12-07 ENCOUNTER — Encounter: Payer: Self-pay | Admitting: Internal Medicine

## 2017-12-07 ENCOUNTER — Ambulatory Visit: Payer: PPO | Admitting: Internal Medicine

## 2017-12-07 VITALS — BP 160/80 | HR 66 | Ht 71.0 in | Wt 213.4 lb

## 2017-12-07 DIAGNOSIS — I4891 Unspecified atrial fibrillation: Secondary | ICD-10-CM | POA: Diagnosis not present

## 2017-12-07 DIAGNOSIS — E785 Hyperlipidemia, unspecified: Secondary | ICD-10-CM | POA: Diagnosis not present

## 2017-12-07 DIAGNOSIS — I10 Essential (primary) hypertension: Secondary | ICD-10-CM

## 2017-12-07 NOTE — Patient Instructions (Signed)
Your physician wants you to follow-up in: ONE YEAR with Dr. Hilty. You will receive a reminder letter in the mail two months in advance. If you don't receive a letter, please call our office to schedule the follow-up appointment.  

## 2017-12-07 NOTE — Progress Notes (Signed)
OFFICE NOTE  Chief Complaint:  Routine follow-up  Primary Care Physician: Tisovec, [name], MD  HPI:  Trevor [name]  is a pleasant 67 year old gentleman with history of resistant hypertension, on numerous medications including clonidine, Tribenzor, hydralazine, and Bystolic. Blood pressures at home seem to be well controlled; however, he does have additional white coat syndrome. Today in the office, his blood pressure was 180/110 and did not come down after another recheck toward the end of the visit. Blood pressures at home, per a diary that he has kept, range between the 120s and 130s up to 160s, with an average blood pressure between 875 and 643 systolic and 80 to 90 diastolic. Overall, I think this is pretty good control. He is asymptomatic. He denies any chest pain, worsening shortness of breath, palpitations, presyncope, or syncopal symptoms. He tells me that he had a recent physical with you and had a lipid profile checked, which was within normal limits. Recent laboratory work was received from your office which demonstrates total cholesterol 128, triglycerides 158, HDL 40 and LDL 56.  Hemoglobin A1c was 5.6.  Some Trevor [name] back in the office today. He is currently without any complaints. He recently had a urinary tract infection and is on anabolic for this. He denies any chest pain or worsening shortness of breath. Blood pressure has been well controlled. He's had no side effects from his cholesterol medicines.  01/07/2016  Trevor [name] presents today for annual follow-up. Overall he seems to be doing quite well. He had a recent office visit with his primary care provider and a clean bill of health. Blood pressure has been reasonably well controlled given the number of medicines he is on. His blood pressures at home range from the mid 130s to 140s although in the office today it was elevated to 166/106. I rechecked the blood pressure at the end of the visit and was 150/80. He denies any  chest pain or shortness of breath.  01/02/2017  I saw Trevor [name] today in follow-up. He reports continuing to feel well. He denies any chest pain or worsening shortness of breath. Occasionally notices heart rate is elevated. He's had no worsening fatigue and continues to work outside in [employer] job. Blood pressure is elevated 174/107 however came down on recheck. His blood pressures at home were reviewed personally indicating good control between the 329J and 188 systolic and diastolics in the 41Y. He is on a number of medications for this. A routine EKG today was performed which indicated an irregularly irregular rhythm that was clearly atrial fibrillation. He has no history of atrial fibrillation and this is a new diagnosis. He denies any chest pain.   03/06/2017  Trevor [name] returns today for follow-up. He underwent successful cardioversion by myself on 02/01/2017. Since then he's maintained sinus rhythm. He reports some improvement in his fatigue. Blood pressure remains elevated today and was 210/112. He reports compliance with his medication. He is now on 5 different blood pressure medications including clonidine twice daily. He denies any end organ side effects such as chest pain, shortness of breath, visual changes or decreased urination.  12/07/2017  Trevor [name] was seen today in follow-up.  Over the last year he is done very well.  He seems to be maintaining sinus rhythm.  He denies chest pain or worsening shortness of breath.  He has no significant fatigue.  Blood pressure initially was 178/90, however I suspect he has significant whitecoat hypertension.  I rechecked it  came down to 160/80.  I reviewed the list of home blood pressure readings, which were generally in the 379-024O systolic with some occasional 140 and 150 readings.  He denies any bleeding issues on Eliquis.  Recent labs demonstrated total cholesterol 118, HDL 39, LDL 57 and triglycerides 110.  PMHx:  Past Medical History:    Diagnosis Date  . Allergy    seasonal  . Dyslipidemia   . Hyperlipidemia    preventitive due to brothers heart issues in his  50's  . Hypertension     Past Surgical History:  Procedure Laterality Date  . CARDIOVERSION N/A 02/01/2017   Procedure: CARDIOVERSION;  Surgeon: Pixie Casino, MD;  Location: [facility] ENDOSCOPY;  Service: Cardiovascular;  Laterality: N/A;  . NM MYOCAR PERF WALL MOTION  2011   persantine myoview - normal perfusion, EF 64%, low risk scan    FAMHx:  Family History  Problem Relation Age of Onset  . Breast cancer Mother   . Heart disease Father   . Prostate cancer Father   . Kidney disease Father   . Heart disease Maternal Grandmother   . Heart disease Maternal Grandfather   . Heart attack Paternal Grandmother   . Lupus Paternal Grandfather   . Heart disease Brother        40's  . Colon cancer Neg Hx     SOCHx:   reports that he has never smoked. He has quit using smokeless tobacco. He reports that he does not drink alcohol or use drugs.  ALLERGIES:  No Known Allergies  ROS: Pertinent items noted in HPI and remainder of comprehensive ROS otherwise negative.  HOME MEDS: Current Outpatient Medications  Medication Sig Dispense Refill  . amLODipine (NORVASC) 10 MG tablet take 1 tablet by mouth once daily 30 tablet 11  . atorvastatin (LIPITOR) 10 MG tablet Take 10 mg by mouth daily.    Marland Kitchen BYSTOLIC 20 MG TABS take 1 tablet by mouth twice a day 60 tablet 11  . cloNIDine (CATAPRES) 0.2 MG tablet take 1 tablet by mouth twice a day 60 tablet 11  . ELIQUIS 5 MG TABS tablet take 1 tablet by mouth twice a day 180 tablet 1  . fluticasone (FLONASE) 50 MCG/ACT nasal spray Place 1 spray into both nostrils daily.    . hydrALAZINE (APRESOLINE) 50 MG tablet take 1 tablet by mouth twice a day 180 tablet 4  . irbesartan-hydrochlorothiazide (AVALIDE) 300-12.5 MG tablet take 1 tablet by mouth once daily 30 tablet 11  . tamsulosin (FLOMAX) 0.4 MG CAPS capsule Take 0.4 mg  by mouth daily.     No current facility-administered medications for this visit.     LABS/IMAGING: No results found for this or any previous visit (from the past 48 hour(s)). No results found.  VITALS: BP (!) 178/90   Pulse 66   Ht 5\' 11"  (1.803 m)   Wt 213 lb 6.4 oz (96.8 kg)   BMI 29.76 kg/m   EXAM: General appearance: alert and no distress Neck: no carotid bruit, no JVD and thyroid not enlarged, symmetric, no tenderness/mass/nodules Lungs: clear to auscultation bilaterally Heart: regular rate and rhythm, S1, S2 normal, no murmur, click, rub or gallop Abdomen: soft, non-tender; bowel sounds normal; no masses,  no organomegaly Extremities: extremities normal, atraumatic, no cyanosis or edema Pulses: 2+ and symmetric Skin: Skin color, texture, turgor normal. No rashes or lesions Neurologic: Grossly normal Psych: Pleasant  EKG: Sinus rhythm with first-degree AV block at 66, minimal voltage criteria for  LVH-personally reviewed  ASSESSMENT: 1. New onset atrial fibrillation - CHADSVASC 2 2. Hypertension 3. Dyslipidemia  PLAN: 1.   Trevor [name] continues to do well maintained sinus rhythm on Eliquis.  His blood pressure is better controlled at home although he has significant whitecoat hypertension.  Cholesterol recently was assessed by his PCP and is well controlled.  Overall he is doing well we will plan to see him back annually or sooner as necessary.  Pixie Casino, MD, [facility], [place] Director of the Advanced Lipid Disorders &  Cardiovascular Risk Reduction Clinic Diplomate of the American Board of Clinical Lipidology Attending Cardiologist  Direct Dial: [phone]  Fax: [phone]  Website:  [email or web address] 12/07/2017, 8:36 AM

## 2017-12-28 ENCOUNTER — Other Ambulatory Visit: Payer: Self-pay | Admitting: Internal Medicine

## 2018-02-26 ENCOUNTER — Other Ambulatory Visit: Payer: Self-pay | Admitting: Internal Medicine

## 2018-03-11 ENCOUNTER — Other Ambulatory Visit: Payer: Self-pay | Admitting: Internal Medicine

## 2018-03-14 ENCOUNTER — Other Ambulatory Visit: Payer: Self-pay

## 2018-03-14 MED ORDER — CLONIDINE HCL 0.2 MG PO TABS: 0.2000 mg | ORAL_TABLET | Freq: Two times a day (BID) | ORAL | 12 refills | Status: DC

## 2018-04-18 DIAGNOSIS — H01025 Squamous blepharitis left lower eyelid: Secondary | ICD-10-CM | POA: Diagnosis not present

## 2018-04-18 DIAGNOSIS — H01024 Squamous blepharitis left upper eyelid: Secondary | ICD-10-CM | POA: Diagnosis not present

## 2018-04-18 DIAGNOSIS — H40013 Open angle with borderline findings, low risk, bilateral: Secondary | ICD-10-CM | POA: Diagnosis not present

## 2018-04-18 DIAGNOSIS — H01022 Squamous blepharitis right lower eyelid: Secondary | ICD-10-CM | POA: Diagnosis not present

## 2018-04-18 DIAGNOSIS — H01021 Squamous blepharitis right upper eyelid: Secondary | ICD-10-CM | POA: Diagnosis not present

## 2018-04-18 DIAGNOSIS — H2513 Age-related nuclear cataract, bilateral: Secondary | ICD-10-CM | POA: Diagnosis not present

## 2018-05-27 ENCOUNTER — Other Ambulatory Visit: Payer: Self-pay | Admitting: Internal Medicine

## 2018-06-26 ENCOUNTER — Other Ambulatory Visit: Payer: Self-pay | Admitting: Internal Medicine

## 2018-08-20 DIAGNOSIS — D225 Melanocytic nevi of trunk: Secondary | ICD-10-CM | POA: Diagnosis not present

## 2018-08-20 DIAGNOSIS — Z85828 Personal history of other malignant neoplasm of skin: Secondary | ICD-10-CM | POA: Diagnosis not present

## 2018-08-20 DIAGNOSIS — L821 Other seborrheic keratosis: Secondary | ICD-10-CM | POA: Diagnosis not present

## 2018-08-20 DIAGNOSIS — D485 Neoplasm of uncertain behavior of skin: Secondary | ICD-10-CM | POA: Diagnosis not present

## 2018-08-20 DIAGNOSIS — L570 Actinic keratosis: Secondary | ICD-10-CM | POA: Diagnosis not present

## 2018-09-03 ENCOUNTER — Other Ambulatory Visit: Payer: Self-pay | Admitting: Internal Medicine

## 2018-09-24 ENCOUNTER — Other Ambulatory Visit: Payer: Self-pay | Admitting: Internal Medicine

## 2018-10-24 ENCOUNTER — Other Ambulatory Visit: Payer: Self-pay | Admitting: Internal Medicine

## 2018-10-24 NOTE — Telephone Encounter (Signed)
Hydralazine 50 mg refilled.

## 2018-11-23 ENCOUNTER — Other Ambulatory Visit: Payer: Self-pay | Admitting: Internal Medicine

## 2018-11-26 NOTE — Telephone Encounter (Signed)
Scr = 0.9 on 11/16/2017 Virtual visit with Dr Debara Pickett  11/29/2018

## 2018-11-27 ENCOUNTER — Telehealth: Payer: Self-pay | Admitting: Internal Medicine

## 2018-11-27 DIAGNOSIS — R972 Elevated prostate specific antigen [PSA]: Secondary | ICD-10-CM | POA: Diagnosis not present

## 2018-11-27 DIAGNOSIS — Z125 Encounter for screening for malignant neoplasm of prostate: Secondary | ICD-10-CM | POA: Diagnosis not present

## 2018-11-27 DIAGNOSIS — E7800 Pure hypercholesterolemia, unspecified: Secondary | ICD-10-CM | POA: Diagnosis not present

## 2018-11-27 DIAGNOSIS — R82998 Other abnormal findings in urine: Secondary | ICD-10-CM | POA: Diagnosis not present

## 2018-11-27 DIAGNOSIS — I10 Essential (primary) hypertension: Secondary | ICD-10-CM | POA: Diagnosis not present

## 2018-11-27 DIAGNOSIS — R7302 Impaired glucose tolerance (oral): Secondary | ICD-10-CM | POA: Diagnosis not present

## 2018-11-27 NOTE — Telephone Encounter (Signed)
Smartphone/ consent/ my chart active/ pre reg completed °

## 2018-11-28 ENCOUNTER — Telehealth: Payer: Self-pay

## 2018-11-28 NOTE — Telephone Encounter (Signed)
Virtual Visit Pre-Appointment Phone Call  "(Name), I am calling you today to discuss your upcoming appointment. We are currently trying to limit exposure to the virus that causes COVID-19 by seeing patients at home rather than in the office."  1. "What is the BEST phone number to call the day of the visit?" - include this in appointment notes  2. "Do you have or have access to (through a family member/friend) a smartphone with video capability that we can use for your visit?" a. If yes - list this number in appt notes as "cell" (if different from BEST phone #) and list the appointment type as a VIDEO visit in appointment notes b. If no - list the appointment type as a PHONE visit in appointment notes  3. Confirm consent - "In the setting of the current Covid19 crisis, you are scheduled for a (phone or video) visit with your provider on (date) at (time).  Just as we do with many in-office visits, in order for you to participate in this visit, we must obtain consent.  If you'd like, I can send this to your mychart (if signed up) or email for you to review.  Otherwise, I can obtain your verbal consent now.  All virtual visits are billed to your insurance company just like a normal visit would be.  By agreeing to a virtual visit, we'd like you to understand that the technology does not allow for your provider to perform an examination, and thus may limit your provider's ability to fully assess your condition. If your provider identifies any concerns that need to be evaluated in person, we will make arrangements to do so.  Finally, though the technology is pretty good, we cannot assure that it will always work on either your or our end, and in the setting of a video visit, we may have to convert it to a phone-only visit.  In either situation, we cannot ensure that we have a secure connection.  Are you willing to proceed?" STAFF: Did the patient verbally acknowledge consent to telehealth visit? Document  YES/NO here: consent obtained yesterday, 11/27/18, by Trevor [name]. 4.  5. Advise patient to be prepared - "Two hours prior to your appointment, go ahead and check your blood pressure, pulse, oxygen saturation, and your weight (if you have the equipment to check those) and write them all down. When your visit starts, your provider will ask you for this information. If you have an Apple Watch or Kardia device, please plan to have heart rate information ready on the day of your appointment. Please have a pen and paper handy nearby the day of the visit as well."  6. Give patient instructions for MyChart download to smartphone OR Doximity/Doxy.me as below if video visit (depending on what platform provider is using)  7. Inform patient they will receive a phone call 15 minutes prior to their appointment time (may be from unknown caller ID) so they should be prepared to answer    TELEPHONE CALL NOTE  Trevor [name] has been deemed a candidate for a follow-up tele-health visit to limit community exposure during the Covid-19 pandemic. I spoke with the patient via phone to ensure availability of phone/video source, confirm preferred email & phone number, and discuss instructions and expectations.  I reminded Trevor [name] to be prepared with any vital sign and/or heart rhythm information that could potentially be obtained via home monitoring, at the time of his visit. I reminded Trevor [name]  to expect a phone call prior to his visit.  Trevor [name], Trevor [name] 11/28/2018 1:27 PM   INSTRUCTIONS FOR DOWNLOADING THE MYCHART APP TO SMARTPHONE  - The patient must first make sure to have activated MyChart and know their login information - If Apple, go to [employer] and type in MyChart in the search bar and download the app. If Android, ask patient to go to [employer] and type in [place] in the search bar and download the app. The app is free but as with any other app downloads, their phone may require  them to verify saved payment information or Apple/Android password.  - The patient will need to then log into the app with their MyChart username and password, and select [facility] as their healthcare provider to link the account. When it is time for your visit, go to the MyChart app, find appointments, and click Begin Video Visit. Be sure to Select Allow for your device to access the Microphone and Camera for your visit. You will then be connected, and your provider will be with you shortly.  **If they have any issues connecting, or need assistance please contact MyChart service desk [phone])**  **If using a computer, in order to ensure the best quality for their visit they will need to use either of the following Internet Browsers: [employer], or Google Chrome**  IF USING DOXIMITY or DOXY.ME - The patient will receive a link just prior to their visit by text.     FULL LENGTH CONSENT FOR TELE-HEALTH VISIT   I hereby voluntarily request, consent and authorize [facility] and its employed or contracted physicians, physician assistants, nurse practitioners or other licensed health care professionals (the Practitioner), to provide me with telemedicine health care services (the "Services") as deemed necessary by the treating Practitioner. I acknowledge and consent to receive the Services by the Practitioner via telemedicine. I understand that the telemedicine visit will involve communicating with the Practitioner through live audiovisual communication technology and the disclosure of certain medical information by electronic transmission. I acknowledge that I have been given the opportunity to request an in-person assessment or other available alternative prior to the telemedicine visit and am voluntarily participating in the telemedicine visit.  I understand that I have the right to withhold or withdraw my consent to the use of telemedicine in the course of my care at any  time, without affecting my right to future care or treatment, and that the Practitioner or I may terminate the telemedicine visit at any time. I understand that I have the right to inspect all information obtained and/or recorded in the course of the telemedicine visit and may receive copies of available information for a reasonable fee.  I understand that some of the potential risks of receiving the Services via telemedicine include:  Marland Kitchen Delay or interruption in medical evaluation due to technological equipment failure or disruption; . Information transmitted may not be sufficient (e.g. poor resolution of images) to allow for appropriate medical decision making by the Practitioner; and/or  . In rare instances, security protocols could fail, causing a breach of personal health information.  Furthermore, I acknowledge that it is my responsibility to provide information about my medical history, conditions and care that is complete and accurate to the best of my ability. I acknowledge that Practitioner's advice, recommendations, and/or decision may be based on factors not within their control, such as incomplete or inaccurate data provided by me or distortions of diagnostic images or specimens  that may result from electronic transmissions. I understand that the practice of medicine is not an exact science and that Practitioner makes no warranties or guarantees regarding treatment outcomes. I acknowledge that I will receive a copy of this consent concurrently upon execution via email to the email address I last provided but may also request a printed copy by calling the office of [facility].    I understand that my insurance will be billed for this visit.   I have read or had this consent read to me. . I understand the contents of this consent, which adequately explains the benefits and risks of the Services being provided via telemedicine.  . I have been provided ample opportunity to ask questions  regarding this consent and the Services and have had my questions answered to my satisfaction. . I give my informed consent for the services to be provided through the use of telemedicine in my medical care  By participating in this telemedicine visit I agree to the above.

## 2018-11-29 ENCOUNTER — Telehealth (INDEPENDENT_AMBULATORY_CARE_PROVIDER_SITE_OTHER): Payer: PPO | Admitting: Internal Medicine

## 2018-11-29 ENCOUNTER — Encounter: Payer: Self-pay | Admitting: Internal Medicine

## 2018-11-29 VITALS — BP 137/79 | HR 67 | Ht 71.0 in | Wt 215.0 lb

## 2018-11-29 DIAGNOSIS — E785 Hyperlipidemia, unspecified: Secondary | ICD-10-CM

## 2018-11-29 DIAGNOSIS — R972 Elevated prostate specific antigen [PSA]: Secondary | ICD-10-CM | POA: Diagnosis not present

## 2018-11-29 DIAGNOSIS — I4891 Unspecified atrial fibrillation: Secondary | ICD-10-CM

## 2018-11-29 DIAGNOSIS — I10 Essential (primary) hypertension: Secondary | ICD-10-CM

## 2018-11-29 DIAGNOSIS — Z7189 Other specified counseling: Secondary | ICD-10-CM

## 2018-11-29 NOTE — Patient Instructions (Signed)
Medication Instructions:  Continue current medications If you need a refill on your cardiac medications before your next appointment, please call your pharmacy.   Lab work: NONE needed If you have labs (blood work) drawn today and your tests are completely normal, you will receive your results only by: Marland Kitchen MyChart Message (if you have MyChart) OR . A paper copy in the mail If you have any lab test that is abnormal or we need to change your treatment, we will call you to review the results.  Testing/Procedures: NONE  Follow-Up: At [facility], you and your health needs are our priority.  As part of our continuing mission to provide you with exceptional heart care, we have created designated Provider Care Teams.  These Care Teams include your primary Cardiologist (physician) and Advanced Practice Providers (APPs -  Physician Assistants and Nurse Practitioners) who all work together to provide you with the care you need, when you need it. You will need a follow up appointment in 12 months.  Please call our office 2 months in advance to schedule this appointment.  You may see Dr. Debara Pickett or one of the following Advanced Practice Providers on your designated Care Team: Almyra Deforest, [place] . Fabian Sharp, PA-C

## 2018-11-29 NOTE — Progress Notes (Signed)
Virtual Visit via Video Note   This visit type was conducted due to national recommendations for restrictions regarding the COVID-19 Pandemic (e.g. social distancing) in an effort to limit this patient's exposure and mitigate transmission in our community.  Due to his co-morbid illnesses, this patient is at least at moderate risk for complications without adequate follow up.  This format is felt to be most appropriate for this patient at this time.  All issues noted in this document were discussed and addressed.  A limited physical exam was performed with this format.  Please refer to the patient's chart for his consent to telehealth for [facility]   Evaluation Performed:  Doximity video visit  Date:  11/29/2018   ID:  TKAI SERFASS, DOB 04/26/1951, MRN [id number]  Patient Location:  [place] [address]  Provider location:   [address] [place], [place] [address]  PCP:  Haywood Pao, MD  Cardiologist:  No primary care provider on file. Electrophysiologist:  None   Chief Complaint:  No complaints  History of Present Illness:    Trevor [name] is a 68 y.o. male who presents via audio/video conferencing for a telehealth visit today.  Mr. De Burrs was seen today for video follow-up.  He denies any chest pain or worsening shortness of breath.  He has no somatic A. fib.  He has no bleeding problems on Eliquis.  His blood pressure is been well controlled.  His abs were recently drawn for an upcoming visit with his primary care provider.  His total cholesterol is 119 and LDL was 55.  Hemoglobin A1c was 5.6.  The only abnormality was that his PSA was elevated at 4.77, up from 3+ according to our labs in 2018.  He does say that he had a history of what sounds like may have been prostatitis in the past.  He is not followed by a urologist currently.  The patient does not have symptoms concerning for COVID-19 infection (fever, chills, cough, or new  SHORTNESS OF BREATH).    Prior CV studies:   The following studies were reviewed today:  Chart review  PMHx:  Past Medical History:  Diagnosis Date  . Allergy    seasonal  . Dyslipidemia   . Hyperlipidemia    preventitive due to brothers heart issues in his  8's  . Hypertension     Past Surgical History:  Procedure Laterality Date  . CARDIOVERSION N/A 02/01/2017   Procedure: CARDIOVERSION;  Surgeon: Pixie Casino, MD;  Location: [facility] ENDOSCOPY;  Service: Cardiovascular;  Laterality: N/A;  . NM MYOCAR PERF WALL MOTION  2011   persantine myoview - normal perfusion, EF 64%, low risk scan    FAMHx:  Family History  Problem Relation Age of Onset  . Breast cancer Mother   . Heart disease Father   . Prostate cancer Father   . Kidney disease Father   . Heart disease Maternal Grandmother   . Heart disease Maternal Grandfather   . Heart attack Paternal Grandmother   . Lupus Paternal Grandfather   . Heart disease Brother        40's  . Colon cancer Neg Hx     SOCHx:   reports that he has never smoked. He has quit using smokeless tobacco. He reports that he does not drink alcohol or use drugs.  ALLERGIES:  No Known Allergies  MEDS:  Current Meds  Medication Sig  . amLODipine (NORVASC) 10 MG tablet  TAKE 1 TABLET BY MOUTH ONCE DAILY  . atorvastatin (LIPITOR) 10 MG tablet Take 10 mg by mouth daily.  Marland Kitchen BYSTOLIC 20 MG TABS TAKE 1 TABLET BY MOUTH TWICE DAILY  . cloNIDine (CATAPRES) 0.2 MG tablet TAKE 1 TABLET(0.2 MG) BY MOUTH TWICE DAILY  . ELIQUIS 5 MG TABS tablet TAKE 1 TABLET BY MOUTH TWICE DAILY  . fluticasone (FLONASE) 50 MCG/ACT nasal spray Place 1 spray into both nostrils daily.  . hydrALAZINE (APRESOLINE) 50 MG tablet TAKE 1 TABLET BY MOUTH TWICE A DAY  . irbesartan-hydrochlorothiazide (AVALIDE) 300-12.5 MG tablet TAKE 1 TABLET BY MOUTH ONCE DAILY  . tamsulosin (FLOMAX) 0.4 MG CAPS capsule Take 0.4 mg by mouth daily.     ROS: Pertinent items noted in HPI and  remainder of comprehensive ROS otherwise negative.  Labs/Other Tests and Data Reviewed:    Recent Labs: No results found for requested labs within last 8760 hours.   Recent Lipid Panel No results found for: CHOL, TRIG, HDL, CHOLHDL, LDLCALC, LDLDIRECT  Wt Readings from Last 3 Encounters:  11/29/18 215 lb (97.5 kg)  12/07/17 213 lb 6.4 oz (96.8 kg)  03/06/17 215 lb (97.5 kg)     Exam:    Vital Signs:  BP 137/79   Pulse 67   Ht 5\' 11"  (1.803 m)   Wt 215 lb (97.5 kg)   BMI 29.99 kg/m    General appearance: alert and no distress Lungs: No visual respiratory difficulty Abdomen: Mildly overweight Extremities: extremities normal, atraumatic, no cyanosis or edema Skin: Skin color, texture, turgor normal. No rashes or lesions Neurologic: Mental status: Alert, oriented, thought content appropriate : Pleasant  ASSESSMENT & PLAN:    1. New onset atrial fibrillation - CHADSVASC 2 on Eliquis 2. Hypertension 3. Dyslipidemia 4. Elevated PSA  Mr. De Burrs is doing well without any symptomatic atrial fibrillation.  He denies any bleeding on Eliquis.  His blood pressures been well controlled.  His cholesterol is at goal.  Recently he has had a rising PSA and he should follow-up with his PCP regarding this.  COVID-19 Education: The signs and symptoms of COVID-19 were discussed with the patient and how to seek care for testing (follow up with PCP or arrange E-visit).  The importance of social distancing was discussed today.  Patient Risk:   After full review of this patients clinical status, I feel that they are at least moderate risk at this time.  Time:   Today, I have spent 25 minutes with the patient with telehealth technology discussing tension, dyslipidemia, atrial fibrillation, elevated PSA.     Medication Adjustments/Labs and Tests Ordered: Current medicines are reviewed at length with the patient today.  Concerns regarding medicines are outlined above.   Tests Ordered: No  orders of the defined types were placed in this encounter.   Medication Changes: No orders of the defined types were placed in this encounter.   Disposition:  in 1 year(s)  Pixie Casino, MD, [facility], [place] Director of the Advanced Lipid Disorders &  Cardiovascular Risk Reduction Clinic Diplomate of the American Board of Clinical Lipidology Attending Cardiologist  Direct Dial: [phone]  Fax: [phone]  Website:  [email or web address]  Pixie Casino, MD  11/29/2018 9:07 AM

## 2018-12-04 DIAGNOSIS — R809 Proteinuria, unspecified: Secondary | ICD-10-CM | POA: Diagnosis not present

## 2018-12-04 DIAGNOSIS — Z7901 Long term (current) use of anticoagulants: Secondary | ICD-10-CM | POA: Diagnosis not present

## 2018-12-04 DIAGNOSIS — E7800 Pure hypercholesterolemia, unspecified: Secondary | ICD-10-CM | POA: Diagnosis not present

## 2018-12-04 DIAGNOSIS — R7302 Impaired glucose tolerance (oral): Secondary | ICD-10-CM | POA: Diagnosis not present

## 2018-12-04 DIAGNOSIS — I129 Hypertensive chronic kidney disease with stage 1 through stage 4 chronic kidney disease, or unspecified chronic kidney disease: Secondary | ICD-10-CM | POA: Diagnosis not present

## 2018-12-04 DIAGNOSIS — Z1331 Encounter for screening for depression: Secondary | ICD-10-CM | POA: Diagnosis not present

## 2018-12-04 DIAGNOSIS — D692 Other nonthrombocytopenic purpura: Secondary | ICD-10-CM | POA: Diagnosis not present

## 2018-12-04 DIAGNOSIS — R972 Elevated prostate specific antigen [PSA]: Secondary | ICD-10-CM | POA: Diagnosis not present

## 2018-12-04 DIAGNOSIS — Z1339 Encounter for screening examination for other mental health and behavioral disorders: Secondary | ICD-10-CM | POA: Diagnosis not present

## 2018-12-04 DIAGNOSIS — N182 Chronic kidney disease, stage 2 (mild): Secondary | ICD-10-CM | POA: Diagnosis not present

## 2018-12-04 DIAGNOSIS — E669 Obesity, unspecified: Secondary | ICD-10-CM | POA: Diagnosis not present

## 2018-12-04 DIAGNOSIS — Z0000 Encounter for general adult medical examination without abnormal findings: Secondary | ICD-10-CM | POA: Diagnosis not present

## 2018-12-04 DIAGNOSIS — I4891 Unspecified atrial fibrillation: Secondary | ICD-10-CM | POA: Diagnosis not present

## 2018-12-20 ENCOUNTER — Other Ambulatory Visit: Payer: Self-pay | Admitting: Internal Medicine

## 2018-12-24 ENCOUNTER — Other Ambulatory Visit: Payer: Self-pay | Admitting: Internal Medicine

## 2018-12-24 NOTE — Telephone Encounter (Signed)
Pt is requesting 5mg  eliquis. Pt is a 76 yom wt of (?) scr:0.8(11/27/18). Pt clears for eliquis 5mg  without the weight being listed

## 2018-12-24 NOTE — Telephone Encounter (Signed)
Last visit via teleheath 11/2018, Dx AF

## 2018-12-24 NOTE — Telephone Encounter (Signed)
Weight 11/2017 98.6 kg .

## 2019-02-07 ENCOUNTER — Other Ambulatory Visit: Payer: Self-pay | Admitting: Internal Medicine

## 2019-03-06 DIAGNOSIS — R972 Elevated prostate specific antigen [PSA]: Secondary | ICD-10-CM | POA: Diagnosis not present

## 2019-04-04 DIAGNOSIS — R972 Elevated prostate specific antigen [PSA]: Secondary | ICD-10-CM | POA: Diagnosis not present

## 2019-04-04 HISTORY — PX: PROSTATE BIOPSY: SHX241

## 2019-04-10 ENCOUNTER — Other Ambulatory Visit: Payer: Self-pay | Admitting: Internal Medicine

## 2019-04-23 DIAGNOSIS — C61 Malignant neoplasm of prostate: Secondary | ICD-10-CM | POA: Diagnosis not present

## 2019-04-24 ENCOUNTER — Encounter: Payer: Self-pay | Admitting: *Deleted

## 2019-04-29 ENCOUNTER — Encounter: Payer: Self-pay | Admitting: Radiation Oncology

## 2019-04-29 NOTE — Progress Notes (Signed)
GU Location of Tumor / Histology: prostatic adenocarcinoma  If Prostate Cancer, Gleason Score is (3 + 4) and PSA is (4.96). Prostate volume: 56.4 cc.  Trevor [name] presented to [name] originally in June 2010 after being referred by [name] for further evaluation of an elevated PSA. Patient reports in 2010 he was treated with antibiotics. Reports when he had his physical in May 2020 [name] referred him back to [name] with an elevated PSA. Then, [name] did his initial biopsy.   Biopsies of prostate (if applicable) revealed:    Past/Anticipated interventions by urology, if any: prostate biopsy, referred to [name] for consideration of radiotherapy  Past/Anticipated interventions by medical oncology, if any: no  Weight changes, if any: Denies  Bowel/Bladder complaints, if any: Reports nocturia x 3. Denies dysuria, hematuria, leakage or incontinence. IPSS 3 with tamsulosin. SHIM 23. Denies any bowel complaints.    Nausea/Vomiting, if any: no  Pain issues, if any:  denies  SAFETY ISSUES:  Prior radiation? denies  Pacemaker/ICD? denies  Possible current pregnancy? no, male patient  Is the patient on methotrexate? Denies  Current Complaints / other details:  68 year old male. Married. Father with history of prostate ca. Mother with history breast ca.

## 2019-04-30 ENCOUNTER — Encounter: Payer: Self-pay | Admitting: Radiation Oncology

## 2019-04-30 ENCOUNTER — Ambulatory Visit
Admission: RE | Admit: 2019-04-30 | Discharge: 2019-04-30 | Disposition: A | Discharge status: Home / Self Care | Payer: PPO | Source: Ambulatory Visit | Attending: Radiation Oncology | Admitting: Radiation Oncology

## 2019-04-30 ENCOUNTER — Other Ambulatory Visit: Payer: Self-pay

## 2019-04-30 VITALS — Ht 71.0 in | Wt 215.0 lb

## 2019-04-30 DIAGNOSIS — C61 Malignant neoplasm of prostate: Secondary | ICD-10-CM

## 2019-04-30 DIAGNOSIS — Z8042 Family history of malignant neoplasm of prostate: Secondary | ICD-10-CM | POA: Diagnosis not present

## 2019-04-30 DIAGNOSIS — R972 Elevated prostate specific antigen [PSA]: Secondary | ICD-10-CM | POA: Diagnosis not present

## 2019-04-30 HISTORY — DX: Malignant neoplasm of prostate: C61

## 2019-04-30 NOTE — Progress Notes (Signed)
Radiation Oncology         [phone] ________________________________  Initial outpatient Consultation - Conducted via MyChart due to current COVID-19 concerns for limiting patient exposure  Name: Trevor [name] MRN: [id number]  Date: 04/30/2019  DOB: 08-08-1950  [id number], Trevor Him, MD  Raynelle Bring, MD   REFERRING PHYSICIAN: Raynelle Bring, MD  DIAGNOSIS: 68 y.o. gentleman with Stage T1c adenocarcinoma of the prostate with Gleason score of 3+4, and PSA of 4.96.    ICD-10-CM   1. Malignant neoplasm of prostate ([place])  C61     HISTORY OF PRESENT ILLNESS: Trevor [name] is a 68 y.o. male with a diagnosis of prostate cancer. He has a history of elevated PSA of 18.2 in 12/2008, associated with prostatitis which was successfully treated with antibiotics (PSA down to 3.08 after treatment). More recenttly, he was noted to have an elevated PSA of 4.8 by his primary care physician, [name]. He was treated with Bactrim, but his PSA had increased to 5.3 in 11/2018.  Accordingly, he was referred for evaluation in urology by [name] on 03/06/2019,  digital rectal examination was performed at that time and was without any concerning nodules. A repeat PSA performed that same day remained elevated at 4.96.  Therefore, the patient proceeded to transrectal ultrasound with 12 biopsies of the prostate on 04/04/2019.  The prostate volume measured 56.4 cc.  Out of 12 core biopsies, 4 were positive.  The maximum Gleason score was 3+4, and this was seen in right apex lateral, left apex (with perineural invasion), and left base lateral. Gleason 3+3 was seen in left mid (small focus). Atypical glands were noted in right apex (small focus).  The patient reviewed the biopsy results with his urologist and he has kindly been referred today for discussion of potential radiation treatment options.   PREVIOUS RADIATION THERAPY: No  PAST MEDICAL HISTORY:  Past Medical History:  Diagnosis Date  . Allergy    seasonal  . Dyslipidemia   . Hyperlipidemia    preventitive due to brothers heart issues in his  80's  . Hypertension   . Prostate cancer ([place])       PAST SURGICAL HISTORY: Past Surgical History:  Procedure Laterality Date  . CARDIOVERSION N/A 02/01/2017   Procedure: CARDIOVERSION;  Surgeon: Pixie Casino, MD;  Location: [facility] ENDOSCOPY;  Service: Cardiovascular;  Laterality: N/A;  . NM MYOCAR PERF WALL MOTION  2011   persantine myoview - normal perfusion, EF 64%, low risk scan    FAMILY HISTORY:  Family History  Problem Relation Age of Onset  . Breast cancer Mother   . Heart disease Father   . Prostate cancer Father   . Kidney disease Father   . Heart disease Maternal Grandmother   . Heart disease Maternal Grandfather   . Heart attack Paternal Grandmother   . Lupus Paternal Grandfather   . Heart disease Brother        40's  . Colon cancer Neg Hx     SOCIAL HISTORY:  Social History   Socioeconomic History  . Marital status: Married    Spouse name: Not on file  . Number of children: 3  . Years of education: B.S.  . Highest education level: Not on file  Occupational History    Comment: full time  Social Needs  . Financial resource strain: Not on file  . Food insecurity    Worry: Not on file    Inability: Not on file  . Transportation needs  Medical: Not on file    Non-medical: Not on file  Tobacco Use  . Smoking status: Never Smoker  . Smokeless tobacco: Former Network engineer and Sexual Activity  . Alcohol use: No  . Drug use: No  . Sexual activity: Not Currently  Lifestyle  . Physical activity    Days per week: Not on file    Minutes per session: Not on file  . Stress: Not on file  Relationships  . Social Herbalist on phone: Not on file    Gets together: Not on file    Attends religious service: Not on file    Active member of club or organization: Not on file    Attends meetings of clubs or organizations: Not on file    Relationship  status: Not on file  . Intimate partner violence    Fear of current or ex partner: Not on file    Emotionally abused: Not on file    Physically abused: Not on file    Forced sexual activity: Not on file  Other Topics Concern  . Not on file  Social History Narrative  . Not on file    ALLERGIES: Patient has no known allergies.  MEDICATIONS:  Current Outpatient Medications  Medication Sig Dispense Refill  . amLODipine (NORVASC) 10 MG tablet TAKE 1 TABLET BY MOUTH EVERY DAY 30 tablet 11  . atorvastatin (LIPITOR) 10 MG tablet Take 10 mg by mouth daily.    Marland Kitchen BYSTOLIC 20 MG TABS TAKE 1 TABLET BY MOUTH TWICE DAILY 60 tablet 11  . cloNIDine (CATAPRES) 0.2 MG tablet TAKE 1 TABLET BY MOUTH TWICE DAILY 60 tablet 7  . ELIQUIS 5 MG TABS tablet TAKE 1 TABLET BY MOUTH TWICE DAILY 180 tablet 1  . fluticasone (FLONASE) 50 MCG/ACT nasal spray Place 1 spray into both nostrils daily.    . hydrALAZINE (APRESOLINE) 50 MG tablet TAKE 1 TABLET BY MOUTH TWICE A DAY 180 tablet 4  . irbesartan-hydrochlorothiazide (AVALIDE) 300-12.5 MG tablet TAKE 1 TABLET BY MOUTH EVERY DAY 30 tablet 11  . tamsulosin (FLOMAX) 0.4 MG CAPS capsule Take 0.4 mg by mouth daily.     No current facility-administered medications for this encounter.     REVIEW OF SYSTEMS:  On review of systems, the patient reports that he is doing well overall. He denies any chest pain, shortness of breath, cough, fevers, chills, night sweats, unintended weight changes. He denies any bowel disturbances, and denies abdominal pain, nausea or vomiting. He denies any new musculoskeletal or joint aches or pains. His IPSS was 3 on tamsulosin, indicating mild and/or controlled urinary symptoms. His only complaint is nocturia x3.  His SHIM was 23, indicating he does not have erectile dysfunction. A complete review of systems is obtained and is otherwise negative.    PHYSICAL EXAM:  Wt Readings from Last 3 Encounters:  04/30/19 215 lb (97.5 kg)  11/29/18 215  lb (97.5 kg)  12/07/17 213 lb 6.4 oz (96.8 kg)   Temp Readings from Last 3 Encounters:  02/01/17 97.7 F (36.5 C) (Oral)   BP Readings from Last 3 Encounters:  11/29/18 137/79  12/07/17 (!) 160/80  03/06/17 (!) 210/112   Pulse Readings from Last 3 Encounters:  11/29/18 67  12/07/17 66  03/06/17 81   Pain Assessment Pain Score: 0-No pain/10  In general this is a well appearing Caucasian gentleman in no acute distress. He's alert and oriented x4 and appropriate throughout the examination. Cardiopulmonary assessment is  negative for acute distress and he exhibits normal effort.   KPS = 90  100 - Normal; no complaints; no evidence of disease. 90   - Able to carry on normal activity; minor signs or symptoms of disease. 80   - Normal activity with effort; some signs or symptoms of disease. 56   - Cares for self; unable to carry on normal activity or to do active work. 60   - Requires occasional assistance, but is able to care for most of his personal needs. 50   - Requires considerable assistance and frequent medical care. 25   - Disabled; requires special care and assistance. 22   - Severely disabled; hospital admission is indicated although death not imminent. 81   - Very sick; hospital admission necessary; active supportive treatment necessary. 10   - Moribund; fatal processes progressing rapidly. 0     - Dead  Karnofsky DA, Abelmann Wells, Craver LS and Burchenal [facility] [phone]) The use of the nitrogen mustards in the palliative treatment of carcinoma: with particular reference to bronchogenic carcinoma Cancer 1 634-56  LABORATORY DATA:  Lab Results  Component Value Date   HGB 17.0 02/01/2017   HCT 50.0 02/01/2017   Lab Results  Component Value Date   NA 145 02/01/2017   K 3.5 02/01/2017   CL 108 02/01/2017   No results found for: ALT, AST, GGT, ALKPHOS, BILITOT   RADIOGRAPHY: No results found.    IMPRESSION/PLAN:  This visit was conducted via MyChart to spare the patient  unnecessary potential exposure in the healthcare setting during the current COVID-19 pandemic.  1. 68 y.o. gentleman with Stage T1c adenocarcinoma of the prostate with Gleason Score of 3+4, and PSA of 4.96. We discussed the patient's workup and outlined the nature of prostate cancer in this setting. The patient's T stage, Gleason's score, and PSA put [name] into the favorable intermediate risk group. Accordingly, he is eligible for a variety of potential treatment options including brachytherapy, 5.5 weeks of external radiation or prostatectomy. We discussed the available radiation techniques, and focused on the details and logistics of delivery. We discussed and outlined the risks, benefits, short and long-term effects associated with radiotherapy and compared and contrasted these with prostatectomy. We discussed the role of SpaceOAR in reducing the rectal toxicity associated with radiotherapy.  He was encouraged to ask questions that were answered to his stated satisfaction.  At the end of the conversation, the patient is interested in moving forward with brachytherapy and use of SpaceOAR to reduce rectal toxicity from radiotherapy.  He appears to have a good understanding of his disease and our recommendations for treatment of curative intent.  We will share our discussion with [name] and move forward with coordinating this procedure in the near future.  The patient has been informed that he will be contacted by Romie Jumper in our office who will be working closely with [name] to coordinate OR scheduling and pre and post procedure appointments.  We will contact the pharmaceutical rep to ensure that [place] is available at the time of procedure.  He will have a prostate MRI following his post-seed CT SIM to confirm appropriate distribution of the [place].  Given current concerns for patient exposure during the COVID-19 pandemic, this encounter was conducted via video-enabled MyChart visit.  The patient  has given verbal consent for this type of encounter. The time spent during this encounter was 50 minutes. The attendants for this meeting include Tyler Pita MD, [address], Katie  Da[place]ck- scribe, patient, Trevor [name] and his wife. During the encounter, Tyler Pita MD, Ashlyn Bruning PA-C, and scribe, Wilburn Mylar were located at [place].  Patient, Trevor [name] and his wife were located at home.    Nicholos Johns, PA-C    Tyler Pita, MD  [facility] Direct Dial: [phone]  Fax: [phone] [email or web address]  Skype  [email or web address]  This document serves as a record of services personally performed by Tyler Pita, MD and Freeman Caldron, PA-C. It was created on their behalf by Wilburn Mylar, a trained medical scribe. The creation of this record is based on the scribe's personal observations and the provider's statements to them. This document has been checked and approved by the attending provider.

## 2019-05-02 ENCOUNTER — Telehealth: Payer: Self-pay | Admitting: *Deleted

## 2019-05-02 NOTE — Telephone Encounter (Signed)
Called patient to ask questions, spoke with patient 

## 2019-05-03 ENCOUNTER — Telehealth: Payer: Self-pay | Admitting: *Deleted

## 2019-05-03 ENCOUNTER — Other Ambulatory Visit: Payer: Self-pay | Admitting: Urology

## 2019-05-03 ENCOUNTER — Telehealth: Payer: Self-pay | Admitting: Internal Medicine

## 2019-05-03 NOTE — Telephone Encounter (Signed)
° °  [facility] Pre-operative Risk Assessment    Request for surgical clearance:  1. What type of surgery is being performed? Cystoscopy Space OAR Radioactive Seed Implant   2. When is this surgery scheduled? June 03, 2019  3. What type of clearance is required (medical clearance vs. Pharmacy clearance to hold med vs. Both)? Both  4. Are there any medications that need to be held prior to surgery and how long? ELIQUIS 5 MG TABS tablet 3 days prior  5. Practice name and name of physician performing surgery? [facility], Wynetta Emery Bordon  6. What is your office phone number [phone] ext [phone]   7.   What is your office fax number [phone]  8.   Anesthesia type (None, local, MAC, general) ?  General   Trevor [name] 05/03/2019, 3:00 PM  _________________________________________________________________   (provider comments below)

## 2019-05-03 NOTE — Telephone Encounter (Signed)
Called patient to inform of pre-seed planning Ct and chest x-ray and EKG for 05-09-19 and his implant for 06-03-19, spoke with patient and he is aware of these appts.

## 2019-05-04 NOTE — Telephone Encounter (Signed)
Patient with diagnosis of afib on Eliquis for anticoagulation.    Procedure: Cystoscopy Space OAR Radioactive Seed Implant  Date of procedure: 06/03/2019  CHADS2-VASc score of  2 ( HTN, AGE)   CrCl 107 ml/min   Per office protocol, patient can hold Eliquis for 3 days prior to procedure.

## 2019-05-06 NOTE — Telephone Encounter (Signed)
Patient notified of clearance via MyChart message

## 2019-05-06 NOTE — Telephone Encounter (Signed)
   Primary Cardiologist: No primary care provider on file.  Chart reviewed as part of pre-operative protocol coverage. Patient was contacted 05/06/2019 in reference to pre-operative risk assessment for pending surgery as outlined below.  Trevor [name] was last seen on 11/29/18 by Dr. Debara Pickett.  Since that day, Trevor [name] has done well.  He can complete more than 4.0 METS without anginal symptoms - he push mows his yard and walks 30 min daily. He does not have a history of MI or stroke.  Per our clinical pharmacist: Patient with diagnosis of afib on Eliquis for anticoagulation.    Procedure: Cystoscopy Space OAR Radioactive Seed Implant Date of procedure: 06/03/2019  CHADS2-VASc score of  2 ( HTN, AGE)   CrCl 107 ml/min  Per office protocol, patient can hold Eliquis for 3 days prior to procedure.  Therefore, based on ACC/AHA guidelines, the patient would be at acceptable risk for the planned procedure without further cardiovascular testing.   I will route this recommendation to the requesting party via Epic fax function and remove from pre-op pool.  Please call with questions.  [place], PA 05/06/2019, 10:05 AM

## 2019-05-08 ENCOUNTER — Telehealth: Payer: Self-pay | Admitting: *Deleted

## 2019-05-08 NOTE — Telephone Encounter (Signed)
CALLED PATIENT TO REMIND OF PRE-SEED APPTS. FOR 05-09-19, SPOKE WITH PATIENT AND HE IS AWARE OF THESE APPTS.

## 2019-05-09 ENCOUNTER — Other Ambulatory Visit: Payer: Self-pay

## 2019-05-09 ENCOUNTER — Ambulatory Visit
Admission: RE | Admit: 2019-05-09 | Discharge: 2019-05-09 | Disposition: A | Discharge status: Home / Self Care | Payer: PPO | Source: Ambulatory Visit | Attending: Urology | Admitting: Urology

## 2019-05-09 ENCOUNTER — Encounter: Payer: Self-pay | Admitting: Medical Oncology

## 2019-05-09 ENCOUNTER — Ambulatory Visit
Admission: RE | Admit: 2019-05-09 | Discharge: 2019-05-09 | Disposition: A | Discharge status: Home / Self Care | Payer: PPO | Source: Ambulatory Visit | Attending: Radiation Oncology | Admitting: Radiation Oncology

## 2019-05-09 ENCOUNTER — Encounter (HOSPITAL_COMMUNITY)
Admission: RE | Admit: 2019-05-09 | Discharge: 2019-05-09 | Disposition: A | Discharge status: Home / Self Care | Payer: PPO | Source: Ambulatory Visit | Attending: Urology | Admitting: Urology

## 2019-05-09 ENCOUNTER — Other Ambulatory Visit: Payer: Self-pay | Admitting: Urology

## 2019-05-09 ENCOUNTER — Ambulatory Visit (HOSPITAL_COMMUNITY)
Admission: RE | Admit: 2019-05-09 | Discharge: 2019-05-09 | Disposition: A | Discharge status: Home / Self Care | Payer: PPO | Source: Ambulatory Visit | Attending: Urology | Admitting: Urology

## 2019-05-09 DIAGNOSIS — Z8546 Personal history of malignant neoplasm of prostate: Secondary | ICD-10-CM | POA: Diagnosis not present

## 2019-05-09 DIAGNOSIS — C61 Malignant neoplasm of prostate: Secondary | ICD-10-CM

## 2019-05-09 DIAGNOSIS — Z01818 Encounter for other preprocedural examination: Secondary | ICD-10-CM | POA: Diagnosis not present

## 2019-05-09 NOTE — Progress Notes (Signed)
  Radiation Oncology         [phone] ________________________________  Name: BREYLAN YENSER MRN: [id number]  Date: 05/09/2019  DOB: Mar 26, 1951  SIMULATION AND TREATMENT PLANNING NOTE PUBIC ARCH STUDY  [id number], [name], MD  [place], [name], MD  DIAGNOSIS: 68 y.o. gentleman with Stage T1c adenocarcinoma of the prostate with Gleason score of 3+4, and PSA of 4.96.     ICD-10-CM   1. Malignant neoplasm of prostate ([place])  C61     COMPLEX SIMULATION:  The patient presented today for evaluation for possible prostate seed implant. He was brought to the radiation planning suite and placed supine on the CT couch. A 3-dimensional image study set was obtained in upload to the planning computer. There, on each axial slice, I contoured the prostate gland. Then, using three-dimensional radiation planning tools I reconstructed the prostate in view of the structures from the transperineal needle pathway to assess for possible pubic arch interference. In doing so, I did not appreciate any pubic arch interference. Also, the patient's prostate volume was estimated based on the drawn structure. The volume was 56 cc.  Given the pubic arch appearance and prostate volume, patient remains a good candidate to proceed with prostate seed implant. Today, he freely provided informed written consent to proceed.    PLAN: The patient will undergo prostate seed implant.   ________________________________  Sheral Apley. Tammi Klippel, M.D.

## 2019-05-09 NOTE — Progress Notes (Signed)
Introduced myself to Trevor [name] as the prostate nurse navigator and discuss my role. I was unable to meet him 10/29, when he consulted with [name].He states his father had prostate cancer and his mother, breast. He has 3 sons and we discussed the importance of early screening.  He has chosen brachytherapy as treatment for his prostate cancer. He has not received a surgery date but will complete pre-surgical testing today. I gave him my business card and asked him to call me with questions or concerns. He voiced understanding.

## 2019-05-29 ENCOUNTER — Telehealth: Payer: Self-pay | Admitting: *Deleted

## 2019-05-29 NOTE — Telephone Encounter (Signed)
Called patient to inform of lab and COVID-19 testing for 05-30-19, spoke with patient and he is aware of these appts.

## 2019-05-30 ENCOUNTER — Other Ambulatory Visit (HOSPITAL_COMMUNITY)
Admission: RE | Admit: 2019-05-30 | Discharge: 2019-05-30 | Disposition: A | Discharge status: Home / Self Care | Payer: PPO | Source: Ambulatory Visit | Attending: Urology | Admitting: Urology

## 2019-05-30 ENCOUNTER — Encounter (HOSPITAL_BASED_OUTPATIENT_CLINIC_OR_DEPARTMENT_OTHER): Payer: Self-pay | Admitting: *Deleted

## 2019-05-30 ENCOUNTER — Other Ambulatory Visit: Payer: Self-pay

## 2019-05-30 ENCOUNTER — Encounter (HOSPITAL_COMMUNITY)
Admission: RE | Admit: 2019-05-30 | Discharge: 2019-05-30 | Disposition: A | Discharge status: Home / Self Care | Payer: PPO | Source: Ambulatory Visit | Attending: Urology | Admitting: Urology

## 2019-05-30 DIAGNOSIS — Z8042 Family history of malignant neoplasm of prostate: Secondary | ICD-10-CM | POA: Diagnosis not present

## 2019-05-30 DIAGNOSIS — Z8249 Family history of ischemic heart disease and other diseases of the circulatory system: Secondary | ICD-10-CM | POA: Diagnosis not present

## 2019-05-30 DIAGNOSIS — Z7901 Long term (current) use of anticoagulants: Secondary | ICD-10-CM | POA: Diagnosis not present

## 2019-05-30 DIAGNOSIS — I10 Essential (primary) hypertension: Secondary | ICD-10-CM | POA: Diagnosis not present

## 2019-05-30 DIAGNOSIS — Z20828 Contact with and (suspected) exposure to other viral communicable diseases: Secondary | ICD-10-CM | POA: Diagnosis not present

## 2019-05-30 DIAGNOSIS — E785 Hyperlipidemia, unspecified: Secondary | ICD-10-CM | POA: Diagnosis not present

## 2019-05-30 DIAGNOSIS — Z79899 Other long term (current) drug therapy: Secondary | ICD-10-CM | POA: Diagnosis not present

## 2019-05-30 DIAGNOSIS — C61 Malignant neoplasm of prostate: Secondary | ICD-10-CM | POA: Diagnosis not present

## 2019-05-30 DIAGNOSIS — Z01812 Encounter for preprocedural laboratory examination: Secondary | ICD-10-CM | POA: Diagnosis not present

## 2019-05-30 DIAGNOSIS — I4891 Unspecified atrial fibrillation: Secondary | ICD-10-CM | POA: Diagnosis not present

## 2019-05-30 LAB — CBC
HCT: 51.8 % (ref 39.0–52.0)
Hemoglobin: 17.5 g/dL — ABNORMAL HIGH (ref 13.0–17.0)
MCH: 29.8 pg (ref 26.0–34.0)
MCHC: 33.8 g/dL (ref 30.0–36.0)
MCV: 88.2 fL (ref 80.0–100.0)
Platelets: 183 10*3/uL (ref 150–400)
RBC: 5.87 MIL/uL — ABNORMAL HIGH (ref 4.22–5.81)
RDW: 13.2 % (ref 11.5–15.5)
WBC: 5.9 10*3/uL (ref 4.0–10.5)
nRBC: 0 % (ref 0.0–0.2)

## 2019-05-30 LAB — COMPREHENSIVE METABOLIC PANEL
ALT: 36 U/L (ref 0–44)
AST: 22 U/L (ref 15–41)
Albumin: 4.6 g/dL (ref 3.5–5.0)
Alkaline Phosphatase: 61 U/L (ref 38–126)
Anion gap: 13 (ref 5–15)
BUN: 20 mg/dL (ref 8–23)
CO2: 26 mmol/L (ref 22–32)
Calcium: 9 mg/dL (ref 8.9–10.3)
Chloride: 105 mmol/L (ref 98–111)
Creatinine, Ser: 0.77 mg/dL (ref 0.61–1.24)
GFR calc Af Amer: >60 mL/min (ref >60)
GFR calc non Af Amer: >60 mL/min (ref >60)
Glucose, Bld: 120 mg/dL — ABNORMAL HIGH (ref 70–99)
Potassium: 3.3 mmol/L — ABNORMAL LOW (ref 3.5–5.1)
Sodium: 144 mmol/L (ref 135–145)
Total Bilirubin: 0.8 mg/dL (ref 0.3–1.2)
Total Protein: 7.5 g/dL (ref 6.5–8.1)

## 2019-05-30 LAB — APTT: aPTT: 42 seconds — ABNORMAL HIGH (ref 24–36)

## 2019-05-30 LAB — PROTIME-INR
INR: 1.2 (ref 0.8–1.2)
Prothrombin Time: 14.9 seconds (ref 11.4–15.2)

## 2019-05-30 NOTE — Progress Notes (Signed)
Spoke w/ via phone for pre-op interview---[facility] needs dos----NONE              Lab results------ EKG AND CHEST XRAY 05-09-19 CHART/EPIC, LABS DONE 05-21-19 CHART/EPIC: CBC, CMET, PT, PTT. ECHO 01-04-17 CHART /Epic, LOV DR HILTY 11-29-18 CHART/EPIC, CARDAIC CLEARANCE NOTE ANGELA DUKE PA AND NOTE TO HOLD ELIQUIS X 3 DAYS BEFORE SURGERY 05-06-19 CHART/EPIC,  COVID test ------05-30-2019 Arrive at -------915 NPO after ------MIDNIGHT Medications to take morning of surgery -----CLONIDINE, HYDRAZALINE, BYSTOLIC, AMLODIPINE Diabetic medication -----N/A Patient Special Instructions ----- Pre-Op special Istructions -----FLEETS ENEMA AM OF SURGERY Patient verbalized understanding of instructions that were given at this phone interview. Patient denies shortness of breath, chest pain, fever, cough a this phone interview.

## 2019-05-31 ENCOUNTER — Telehealth: Payer: Self-pay | Admitting: *Deleted

## 2019-05-31 ENCOUNTER — Other Ambulatory Visit: Payer: Self-pay | Admitting: Urology

## 2019-05-31 LAB — NOVEL CORONAVIRUS, NAA (HOSP ORDER, SEND-OUT TO REF LAB; TAT 18-24 HRS): SARS-CoV-2, NAA: NOT DETECTED

## 2019-05-31 MED ORDER — ALPRAZOLAM 0.5 MG PO TABS: 0.5000 mg | ORAL_TABLET | ORAL | 0 refills | Status: DC | PRN

## 2019-05-31 NOTE — Telephone Encounter (Signed)
CALLED PATIENT TO REMIND OF PROCEDURE FOR 06-03-19, SPOKE WITH PATIENT AND HE IS AWARE OF THIS PROCEDURE.

## 2019-05-31 NOTE — H&P (Signed)
Prostate Cancer   PCP: [name]   Trevor [name] is a 68 year old gentleman who was noted to have an elevated PSA of 4.96 prompting a TRUS biopsy of the prostate on 04/04/19. This demonstrated Gleason 3+4=7 adenocarcinoma with 4 out of 12 biopsy cores positive for malignancy.   Family history: Father - treated with surgery and XRT (died of unrelated causes at 27)   Imaging studies: None.   PMH: He has a history of atrial fibrillation (Eliquis), 5 medication hypertension, and hyperlipidemia.  PSH: No abdominal surgeries.   TNM stage: cT1c Nx Mx  PSA: 4.96  Gleason score: 3+4=7 (grade group 2)  Biopsy (04/04/19): 4/12 cores positive  Left: L apex (20%, 3+4=7, PNI), L mid (5%, 3+3=6), L lateral base (20%, 3+4=7)  Right: R lateral apex (10%, 3+4=7)  Prostate volume: 56.4 cc   Nomogram  OC disease: 64%  EPE: 34%  SVI: 3%  LNI: 3%  PFS (5 year, 10 year): 87%, 78%   Urinary function: IPSS is 7. This is primarily related to nocturia and frequency.  Erectile function: SHIM score is 17. He estimates that he can achieve erections adequate for intercourse approximately 80% of the time without medication.     ALLERGIES: No Allergies    MEDICATIONS: Amlodipine Besylate 10 mg tablet  Amlodipine Besylate-Benazepril 10 mg-20 mg capsule Oral  Atorvastatin Calcium 10 mg tablet  Bystolic 20 mg tablet  Clonidine Hcl 0.2 mg tablet  Eliquis 5 mg tablet  Fluticasone Propionate  Hydralazine Hcl 50 mg tablet  Irbesartan-Hydrochlorothiazide 300 mg-12.5 mg tablet     GU PSH: Prostate Needle Biopsy - 04/04/2019       PSH Notes: Atrial Fibrillation 2018   NON-GU PSH: Surgical Pathology, Gross And Microscopic Examination For Prostate Needle - 04/04/2019     GU PMH: Elevated PSA, Elevated prostate specific antigen (PSA) - 2014      PMH Notes:   1) Elevated PSA: He was found to have a PSA of 18.2 in April 2010. However, this was during an episode of prostatitis. Repeat PSA was found  to have decreased to 3.08 in July 2010 after antibiotic therapy. He does have a paternal family history of prostate cancer. His father was diagnosed in his 50s and has been treated with surgery and radiation. His father is currently living at age 59.   2) Hematuria: He was noted to have microscopic hematuria in June 2010. He denies tobacco use. This was apparently noted during an active urinary tract infection. He did not have hematuria on a urinalysis in our office.     NON-GU PMH: Atrial Fibrillation Hypercholesterolemia Hypertension    FAMILY HISTORY: Breast Cancer - Mother Heart Disease - Father, Brother Hematuria - Father nephrolithiasis - Father, Brother Prostate Cancer - Father   SOCIAL HISTORY: Marital Status: Married Preferred Language: English; Ethnicity: Not Hispanic Or Latino; Race: White Current Smoking Status: Patient has never smoked.   Tobacco Use Assessment Completed: Used Tobacco in last 30 days? Has never drank.  Does not use drugs. Drinks 3 caffeinated drinks per day.     Notes: Occupation:, Alcohol Use, Marital History - Currently Married, Tobacco Use   REVIEW OF SYSTEMS:    GU Review Male:   Patient reports frequent urination and get up at night to urinate. Patient denies hard to postpone urination, burning/ pain with urination, leakage of urine, stream starts and stops, trouble starting your streams, and have to strain to urinate .  Gastrointestinal (Upper):  Patient denies nausea and vomiting.  Gastrointestinal (Lower):   Patient denies diarrhea and constipation.  Constitutional:   Patient denies fever, night sweats, weight loss, and fatigue.  Skin:   Patient denies skin rash/ lesion and itching.  Eyes:   Patient denies blurred vision and double vision.  Ears/ Nose/ Throat:   Patient denies sinus problems and sore throat.  Hematologic/Lymphatic:   Patient denies swollen glands and easy bruising.  Cardiovascular:   Patient denies leg swelling and chest  pains.  Respiratory:   Patient denies cough and shortness of breath.  Endocrine:   Patient denies excessive thirst.  Musculoskeletal:   Patient denies back pain and joint pain.  Neurological:   Patient denies headaches and dizziness.  Psychologic:   Patient denies depression and anxiety.   VITAL SIGNS:    Weight 215 lb / 97.52 kg  Height 71 in / 180.34 cm  BMI 30.0 kg/m   MULTI-SYSTEM PHYSICAL EXAMINATION:    Constitutional: Well-nourished. No physical deformities. Normally developed. Good grooming.  CV: RRR Lungs Clear   ASSESSMENT:      ICD-10 Details  1 GU:   Prostate Cancer - C61    PLAN:      1. Favorable intermediate risk prostate cancer: He has elected to undergo a radiation seed implant for treatment of his prostate cancer.  I discussed the potential benefits and risks of the procedure, side effects of the proposed treatment, the likelihood of the patient achieving the goals of the procedure, and any potential problems that might occur during the procedure or recuperation.

## 2019-06-03 ENCOUNTER — Ambulatory Visit (HOSPITAL_BASED_OUTPATIENT_CLINIC_OR_DEPARTMENT_OTHER): Payer: PPO | Admitting: Anesthesiology

## 2019-06-03 ENCOUNTER — Encounter (HOSPITAL_BASED_OUTPATIENT_CLINIC_OR_DEPARTMENT_OTHER): Payer: Self-pay

## 2019-06-03 ENCOUNTER — Ambulatory Visit (HOSPITAL_COMMUNITY): Payer: PPO

## 2019-06-03 ENCOUNTER — Encounter (HOSPITAL_BASED_OUTPATIENT_CLINIC_OR_DEPARTMENT_OTHER)
Admission: RE | Disposition: A | Discharge status: Home / Self Care | Payer: Self-pay | Source: Home / Self Care | Attending: Urology

## 2019-06-03 ENCOUNTER — Ambulatory Visit (HOSPITAL_BASED_OUTPATIENT_CLINIC_OR_DEPARTMENT_OTHER)
Admission: RE | Admit: 2019-06-03 | Discharge: 2019-06-03 | Disposition: A | Discharge status: Home / Self Care | Payer: PPO | Attending: Urology | Admitting: Urology

## 2019-06-03 DIAGNOSIS — Z7901 Long term (current) use of anticoagulants: Secondary | ICD-10-CM | POA: Diagnosis not present

## 2019-06-03 DIAGNOSIS — Z8042 Family history of malignant neoplasm of prostate: Secondary | ICD-10-CM | POA: Diagnosis not present

## 2019-06-03 DIAGNOSIS — I10 Essential (primary) hypertension: Secondary | ICD-10-CM | POA: Insufficient documentation

## 2019-06-03 DIAGNOSIS — I4891 Unspecified atrial fibrillation: Secondary | ICD-10-CM | POA: Insufficient documentation

## 2019-06-03 DIAGNOSIS — Z8249 Family history of ischemic heart disease and other diseases of the circulatory system: Secondary | ICD-10-CM | POA: Diagnosis not present

## 2019-06-03 DIAGNOSIS — E785 Hyperlipidemia, unspecified: Secondary | ICD-10-CM | POA: Insufficient documentation

## 2019-06-03 DIAGNOSIS — Z79899 Other long term (current) drug therapy: Secondary | ICD-10-CM | POA: Insufficient documentation

## 2019-06-03 DIAGNOSIS — C61 Malignant neoplasm of prostate: Secondary | ICD-10-CM | POA: Insufficient documentation

## 2019-06-03 HISTORY — PX: RADIOACTIVE SEED IMPLANT: SHX5150

## 2019-06-03 HISTORY — DX: Cardiac arrhythmia, unspecified: I49.9

## 2019-06-03 HISTORY — PX: SPACE OAR INSTILLATION: SHX6769

## 2019-06-03 SURGERY — INSERTION, RADIATION SOURCE, PROSTATE
Anesthesia: General | Site: Rectum

## 2019-06-03 MED ORDER — TRAMADOL HCL 50 MG PO TABS: 50.0000 mg | ORAL_TABLET | Freq: Four times a day (QID) | ORAL | 0 refills | Status: DC | PRN

## 2019-06-03 MED ORDER — CIPROFLOXACIN IN D5W 400 MG/200ML IV SOLN
INTRAVENOUS | Status: AC
  Filled 2019-06-03: qty 200

## 2019-06-03 MED ORDER — SODIUM CHLORIDE 0.9 % IR SOLN
Status: DC | PRN
  Administered 2019-06-03: 1000 mL via INTRAVESICAL

## 2019-06-03 MED ORDER — LIDOCAINE 2% (20 MG/ML) 5 ML SYRINGE
INTRAMUSCULAR | Status: DC | PRN
  Administered 2019-06-03: 100 mg via INTRAVENOUS

## 2019-06-03 MED ORDER — FENTANYL CITRATE (PF) 100 MCG/2ML IJ SOLN
INTRAMUSCULAR | Status: AC
  Filled 2019-06-03: qty 2

## 2019-06-03 MED ORDER — FENTANYL CITRATE (PF) 100 MCG/2ML IJ SOLN
INTRAMUSCULAR | Status: DC | PRN
  Administered 2019-06-03: 25 ug via INTRAVENOUS
  Administered 2019-06-03: 50 ug via INTRAVENOUS
  Administered 2019-06-03 (×2): 25 ug via INTRAVENOUS

## 2019-06-03 MED ORDER — MEPERIDINE HCL 25 MG/ML IJ SOLN
6.2500 mg | INTRAMUSCULAR | Status: DC | PRN
  Filled 2019-06-03: qty 1

## 2019-06-03 MED ORDER — LIDOCAINE 2% (20 MG/ML) 5 ML SYRINGE
INTRAMUSCULAR | Status: AC
  Filled 2019-06-03: qty 5

## 2019-06-03 MED ORDER — IOHEXOL 300 MG/ML  SOLN
INTRAMUSCULAR | Status: DC | PRN
  Administered 2019-06-03: 7 mL

## 2019-06-03 MED ORDER — LABETALOL HCL 5 MG/ML IV SOLN
INTRAVENOUS | Status: AC
  Filled 2019-06-03: qty 4

## 2019-06-03 MED ORDER — DEXAMETHASONE SODIUM PHOSPHATE 10 MG/ML IJ SOLN
INTRAMUSCULAR | Status: DC | PRN
  Administered 2019-06-03: 8 mg via INTRAVENOUS

## 2019-06-03 MED ORDER — LABETALOL HCL 5 MG/ML IV SOLN
10.0000 mg | INTRAVENOUS | Status: DC | PRN
  Filled 2019-06-03: qty 4

## 2019-06-03 MED ORDER — LACTATED RINGERS IV SOLN
INTRAVENOUS | Status: DC
  Filled 2019-06-03: qty 1000

## 2019-06-03 MED ORDER — ACETAMINOPHEN 325 MG PO TABS
325.0000 mg | ORAL_TABLET | Freq: Once | ORAL | Status: DC | PRN
  Filled 2019-06-03: qty 2

## 2019-06-03 MED ORDER — STERILE WATER FOR INJECTION IJ SOLN
INTRAMUSCULAR | Status: DC | PRN
  Administered 2019-06-03: 3 mL

## 2019-06-03 MED ORDER — SILODOSIN 4 MG PO CAPS: 4.0000 mg | ORAL_CAPSULE | Freq: Every day | ORAL | 0 refills | Status: DC

## 2019-06-03 MED ORDER — PROPOFOL 10 MG/ML IV BOLUS
INTRAVENOUS | Status: AC
  Filled 2019-06-03: qty 20

## 2019-06-03 MED ORDER — EPHEDRINE 5 MG/ML INJ
INTRAVENOUS | Status: AC
  Filled 2019-06-03: qty 10

## 2019-06-03 MED ORDER — LABETALOL HCL 5 MG/ML IV SOLN
5.0000 mg | INTRAVENOUS | Status: DC | PRN
  Administered 2019-06-03: 5 mg via INTRAVENOUS
  Filled 2019-06-03: qty 4

## 2019-06-03 MED ORDER — PHENYLEPHRINE 40 MCG/ML (10ML) SYRINGE FOR IV PUSH (FOR BLOOD PRESSURE SUPPORT)
PREFILLED_SYRINGE | INTRAVENOUS | Status: AC
  Filled 2019-06-03: qty 10

## 2019-06-03 MED ORDER — MIDAZOLAM HCL 2 MG/2ML IJ SOLN
INTRAMUSCULAR | Status: AC
  Filled 2019-06-03: qty 2

## 2019-06-03 MED ORDER — ACETAMINOPHEN 500 MG PO TABS
ORAL_TABLET | ORAL | Status: AC
  Filled 2019-06-03: qty 2

## 2019-06-03 MED ORDER — OXYCODONE HCL 5 MG/5ML PO SOLN
5.0000 mg | Freq: Once | ORAL | Status: DC | PRN
  Filled 2019-06-03: qty 5

## 2019-06-03 MED ORDER — PHENYLEPHRINE 40 MCG/ML (10ML) SYRINGE FOR IV PUSH (FOR BLOOD PRESSURE SUPPORT)
PREFILLED_SYRINGE | INTRAVENOUS | Status: DC | PRN
  Administered 2019-06-03 (×2): 120 ug via INTRAVENOUS
  Administered 2019-06-03: 80 ug via INTRAVENOUS
  Administered 2019-06-03: 120 ug via INTRAVENOUS
  Administered 2019-06-03 (×2): 80 ug via INTRAVENOUS

## 2019-06-03 MED ORDER — MIDAZOLAM HCL 2 MG/2ML IJ SOLN
INTRAMUSCULAR | Status: DC | PRN
  Administered 2019-06-03: 2 mg via INTRAVENOUS

## 2019-06-03 MED ORDER — ACETAMINOPHEN 10 MG/ML IV SOLN
1000.0000 mg | Freq: Once | INTRAVENOUS | Status: DC | PRN
  Filled 2019-06-03: qty 100

## 2019-06-03 MED ORDER — PROMETHAZINE HCL 25 MG/ML IJ SOLN
6.2500 mg | INTRAMUSCULAR | Status: DC | PRN
  Filled 2019-06-03: qty 1

## 2019-06-03 MED ORDER — ACETAMINOPHEN 160 MG/5ML PO SOLN
325.0000 mg | Freq: Once | ORAL | Status: DC | PRN
  Filled 2019-06-03: qty 20.3

## 2019-06-03 MED ORDER — HYDROMORPHONE HCL 1 MG/ML IJ SOLN
0.2500 mg | INTRAMUSCULAR | Status: DC | PRN
  Filled 2019-06-03: qty 0.5

## 2019-06-03 MED ORDER — SODIUM CHLORIDE (PF) 0.9 % IJ SOLN
INTRAMUSCULAR | Status: DC | PRN
  Administered 2019-06-03: 10 mL

## 2019-06-03 MED ORDER — FLEET ENEMA 7-19 GM/118ML RE ENEM
1.0000 | ENEMA | Freq: Once | RECTAL | Status: DC
  Filled 2019-06-03: qty 1

## 2019-06-03 MED ORDER — ONDANSETRON HCL 4 MG/2ML IJ SOLN
INTRAMUSCULAR | Status: AC
  Filled 2019-06-03: qty 2

## 2019-06-03 MED ORDER — OXYCODONE HCL 5 MG PO TABS
5.0000 mg | ORAL_TABLET | Freq: Once | ORAL | Status: DC | PRN
  Filled 2019-06-03: qty 1

## 2019-06-03 MED ORDER — CIPROFLOXACIN IN D5W 400 MG/200ML IV SOLN
400.0000 mg | INTRAVENOUS | Status: AC
  Administered 2019-06-03: 400 mg via INTRAVENOUS
  Filled 2019-06-03: qty 200

## 2019-06-03 MED ORDER — EPHEDRINE SULFATE-NACL 50-0.9 MG/10ML-% IV SOSY
PREFILLED_SYRINGE | INTRAVENOUS | Status: DC | PRN
  Administered 2019-06-03: 10 mg via INTRAVENOUS

## 2019-06-03 MED ORDER — ONDANSETRON HCL 4 MG/2ML IJ SOLN
INTRAMUSCULAR | Status: DC | PRN
  Administered 2019-06-03: 4 mg via INTRAVENOUS

## 2019-06-03 MED ORDER — DEXAMETHASONE SODIUM PHOSPHATE 10 MG/ML IJ SOLN
INTRAMUSCULAR | Status: AC
  Filled 2019-06-03: qty 1

## 2019-06-03 MED ORDER — ACETAMINOPHEN 325 MG PO TABS
ORAL_TABLET | ORAL | Status: DC | PRN
  Administered 2019-06-03: 1000 mg via ORAL

## 2019-06-03 MED ORDER — LACTATED RINGERS IV SOLN
INTRAVENOUS | Status: DC
  Administered 2019-06-03 (×2): via INTRAVENOUS
  Filled 2019-06-03: qty 1000

## 2019-06-03 MED ORDER — PROPOFOL 10 MG/ML IV BOLUS
INTRAVENOUS | Status: DC | PRN
  Administered 2019-06-03: 20 mg via INTRAVENOUS
  Administered 2019-06-03: 150 mg via INTRAVENOUS
  Administered 2019-06-03: 50 mg via INTRAVENOUS

## 2019-06-03 SURGICAL SUPPLY — 34 items
BAG URINE DRAIN 2000ML AR STRL (UROLOGICAL SUPPLIES) ×3 IMPLANT
BLADE CLIPPER SENSICLIP SURGIC (BLADE) ×3 IMPLANT
CATH FOLEY 2WAY SLVR  5CC 16FR (CATHETERS) ×1
CATH FOLEY 2WAY SLVR 5CC 16FR (CATHETERS) ×2 IMPLANT
CATH ROBINSON RED A/P 16FR (CATHETERS) IMPLANT
CATH ROBINSON RED A/P 20FR (CATHETERS) ×3 IMPLANT
CLOTH BEACON ORANGE TIMEOUT ST (SAFETY) ×3 IMPLANT
CONT SPEC 4OZ CLIKSEAL STRL BL (MISCELLANEOUS) ×6 IMPLANT
COVER BACK TABLE 60X90IN (DRAPES) ×3 IMPLANT
COVER MAYO STAND STRL (DRAPES) ×3 IMPLANT
DRSG TEGADERM 4X4.75 (GAUZE/BANDAGES/DRESSINGS) ×5 IMPLANT
DRSG TEGADERM 8X12 (GAUZE/BANDAGES/DRESSINGS) ×6 IMPLANT
GAUZE SPONGE 4X4 12PLY STRL LF (GAUZE/BANDAGES/DRESSINGS) ×1 IMPLANT
GLOVE BIO SURGEON STRL SZ7.5 (GLOVE) ×6 IMPLANT
GLOVE BIO SURGEON STRL SZ8 (GLOVE) ×1 IMPLANT
GLOVE SURG ORTHO 8.5 STRL (GLOVE) ×3 IMPLANT
GLOVE SURG SS PI 6.5 STRL IVOR (GLOVE) IMPLANT
GOWN STRL REUS W/TWL LRG LVL3 (GOWN DISPOSABLE) ×3 IMPLANT
GOWN STRL REUS W/TWL XL LVL3 (GOWN DISPOSABLE) ×1 IMPLANT
HOLDER FOLEY CATH W/STRAP (MISCELLANEOUS) ×3 IMPLANT
I-Seed AgX100 Iodine-125 Device ×72 IMPLANT
IMPL SPACEOAR SYSTEM 10ML (Spacer) ×2 IMPLANT
IMPLANT SPACEOAR SYSTEM 10ML (Spacer) ×3 IMPLANT
IV NS 1000ML (IV SOLUTION) ×1
IV NS 1000ML BAXH (IV SOLUTION) ×2 IMPLANT
KIT TURNOVER CYSTO (KITS) ×3 IMPLANT
MARKER SKIN DUAL TIP RULER LAB (MISCELLANEOUS) ×3 IMPLANT
PACK CYSTO (CUSTOM PROCEDURE TRAY) ×3 IMPLANT
SURGILUBE 2OZ TUBE FLIPTOP (MISCELLANEOUS) ×3 IMPLANT
SUT BONE WAX W31G (SUTURE) IMPLANT
SYR 10ML LL (SYRINGE) ×2 IMPLANT
TOWEL OR 17X26 10 PK STRL BLUE (TOWEL DISPOSABLE) ×3 IMPLANT
UNDERPAD 30X30 (UNDERPADS AND DIAPERS) ×6 IMPLANT
WATER STERILE IRR 500ML POUR (IV SOLUTION) ×3 IMPLANT

## 2019-06-03 NOTE — Anesthesia Postprocedure Evaluation (Signed)
Anesthesia Post Note  Patient: Trevor [name]  Procedure(s) Performed: RADIOACTIVE SEED IMPLANT/BRACHYTHERAPY IMPLANT WITH CYSTOSCOPY (N/A Prostate) SPACE OAR INSTILLATION (N/A Rectum)     Patient location during evaluation: PACU Anesthesia Type: General Level of consciousness: awake and alert Pain management: pain level controlled Vital Signs Assessment: post-procedure vital signs reviewed and stable Respiratory status: spontaneous breathing, nonlabored ventilation, respiratory function stable and patient connected to nasal cannula oxygen Cardiovascular status: blood pressure returned to baseline and stable Postop Assessment: no apparent nausea or vomiting Anesthetic complications: no    Last Vitals:  Vitals:   06/03/19 1315 06/03/19 1330  BP: (!) 155/98 (!) 153/91  Pulse: 75 72  Resp: 19 16  Temp:    SpO2: 96% 94%    Last Pain:  Vitals:   06/03/19 1330  TempSrc:   PainSc: 0-No pain                 Effie Berkshire

## 2019-06-03 NOTE — Transfer of Care (Signed)
Immediate Anesthesia Transfer of Care Note  Patient: Trevor [name]  Procedure(s) Performed: RADIOACTIVE SEED IMPLANT/BRACHYTHERAPY IMPLANT WITH CYSTOSCOPY (N/A Prostate) SPACE OAR INSTILLATION (N/A Rectum)  Patient Location: PACU  Anesthesia Type:General  Level of Consciousness: awake, alert , oriented and patient cooperative  Airway & Oxygen Therapy: Patient Spontanous Breathing and Patient connected to face mask oxygen  Post-op Assessment: Report given to RN and Post -op Vital signs reviewed and stable  Post vital signs: Reviewed and stable  Last Vitals:  Vitals Value Taken Time  BP 153/91 06/03/19 1259  Temp 36.5 C 06/03/19 1259  Pulse 73 06/03/19 1259  Resp 14 06/03/19 1259  SpO2 100 % 06/03/19 1259    Last Pain:  Vitals:   06/03/19 0940  TempSrc: Oral      Patients Stated Pain Goal: 6 ([id number])  Complications: No apparent anesthesia complications

## 2019-06-03 NOTE — Anesthesia Procedure Notes (Signed)
Procedure Name: LMA Insertion Date/Time: 06/03/2019 11:34 AM Performed by: Justice Rocher, CRNA Pre-anesthesia Checklist: Patient identified, Emergency Drugs available, Suction available and Patient being monitored Patient Re-evaluated:Patient Re-evaluated prior to induction Oxygen Delivery Method: Circle system utilized Preoxygenation: Pre-oxygenation with 100% oxygen Induction Type: IV induction Ventilation: Mask ventilation without difficulty LMA: LMA inserted LMA Size: 5.0 Number of attempts: 1 Airway Equipment and Method: Bite block Placement Confirmation: positive ETCO2 and breath sounds checked- equal and bilateral Tube secured with: Tape Dental Injury: Teeth and Oropharynx as per pre-operative assessment

## 2019-06-03 NOTE — Progress Notes (Signed)
Pt on flomax PTA.  Rapaflo added at d/c.  Spoke with [name].. Pt to stay on flomax as he was taking prior to arrival. Pt called and informed that he can just stay on flomax . Ok not to purchase rapaflo per [name]. Pt verbalized his understanding.

## 2019-06-03 NOTE — Op Note (Signed)
Preoperative diagnosis: Clinically localized adenocarcinoma of the prostate (T1c Nx Mx)  Postoperative diagnosis: Clinically localized adenocarcinoma of the prostate  Procedure: 1) Transperineal placement of radioactive seeds into the prostate                    2) Cystoscopy                    3) Insertion of SpaceOAR hydrogel   Surgeon: Pryor Curia. M.D.  Radiation oncologist: Dr. Ledon Snare  Anesthesia: General  EBL: Minimal  Complications: None  Indication: Trevor [name] is a 68 y.o. gentleman with clinically localized prostate cancer. After discussing management options for treatment, he elected to proceed with radiotherapy. He presents today for the above procedures. The potential risks, complications, alternative options, and expected recovery course have been discussed in detail with the patient and he has provided informed consent to proceed.  Description of procedure: The patient was taken to the operating room and general anesthesia was induced. He was administered preoperative antibiotics, placed in the dorsal lithotomy position, and prepped and draped in the usual sterile fashion. Next, intraoperative transrectal ultrasonography was utilized for real-time intraoperative planning by the radiation oncology team. Once the treatment plan was completed and the seed strands created, stranded iodine 125 radiation seeds were placed utilizing a brachytherapy perineal template. 64 radioactive iodine 125 seeds into the prostate through 25 catheter needles.  The brachytherapy template was then removed.  A site in the midline was selected on the perineum for placement of an 18 g needle with saline.  The needle was advanced above the rectum and below Denonvillier's fascia to the mid gland and confirmed to be in the midline on transverse imaging.  One cc of saline was injected confirming appropriate expansion of this space.  A total of 5 cc of saline was then injected to open the space  further bilaterally.  The saline syringe was then removed and the SpaceOAR hydrogel was injected with good distribution bilaterally. Position of the radiation seeds was confirmed on fluoroscopic imaging.  Flexible cystoscopy was then performed and no seeds were identified within the bladder.  No bladder tumors, stones, or other mucosal pathology was identified within the bladder. He tolerated the procedure well and without complications. He was able to be transferred to the recovery unit in satisfactory condition.  He was given a voiding trial in the PACU.

## 2019-06-03 NOTE — Progress Notes (Signed)
Dr. Smith Robert aware of B/P 193/113 orders received

## 2019-06-03 NOTE — Anesthesia Preprocedure Evaluation (Addendum)
Anesthesia Evaluation  Patient identified by MRN, date of birth, ID band Patient awake    Reviewed: Allergy & Precautions, NPO status , Patient's Chart, lab work & pertinent test results, reviewed documented beta blocker date and time   Airway Mallampati: II  TM Distance: >3 FB Neck ROM: Full    Dental  (+) Teeth Intact, Dental Advisory Given   Pulmonary Current Smoker and Patient abstained from smoking.,    breath sounds clear to auscultation       Cardiovascular hypertension, Pt. on medications and Pt. on home beta blockers + dysrhythmias Atrial Fibrillation  Rhythm:Regular Rate:Normal     Neuro/Psych negative neurological ROS  negative psych ROS   GI/Hepatic negative GI ROS, Neg liver ROS,   Endo/Other  negative endocrine ROS  Renal/GU negative Renal ROS     Musculoskeletal negative musculoskeletal ROS (+)   Abdominal Normal abdominal exam  (+)   Peds  Hematology negative hematology ROS (+)   Anesthesia Other Findings   Reproductive/Obstetrics                            Anesthesia Physical Anesthesia Plan  ASA: III  Anesthesia Plan: General   Post-op Pain Management:    Induction: Intravenous  PONV Risk Score and Plan: 2 and Dexamethasone, Ondansetron, Midazolam and Treatment may vary due to age or medical condition  Airway Management Planned: LMA  Additional Equipment: None  Intra-op Plan:   Post-operative Plan: Extubation in OR  Informed Consent: I have reviewed the patients History and Physical, chart, labs and discussed the procedure including the risks, benefits and alternatives for the proposed anesthesia with the patient or authorized representative who has indicated his/her understanding and acceptance.     Dental advisory given  Plan Discussed with: CRNA  Anesthesia Plan Comments:         Anesthesia Quick Evaluation

## 2019-06-03 NOTE — Discharge Instructions (Signed)
Brachytherapy for Prostate Cancer, Care After  This sheet gives you information about how to care for yourself after your procedure. Your health care provider may also give you more specific instructions. If you have problems or questions, contact your health care provider. What can I expect after the procedure? After the procedure, it is common to have:  Trouble passing urine.  Blood in the urine or semen.  Constipation.  Frequent feeling of an urgent need to urinate.  Bruising, swelling, and tenderness of the area behind the scrotum (perineum).  Bloating and gas.  Fatigue.  Burning or pain in the rectum.  Problems getting or keeping an erection (erectile dysfunction).  Nausea. Follow these instructions at home: Managing pain, stiffness, and swelling  If directed, apply ice to the affected area: ? Put ice in a plastic bag. ? Place a towel between your skin and the bag. ? Leave the ice on for 20 minutes, 2-3 times a day.  Try not to sit directly on the area behind the scrotum. A soft cushion can help with discomfort. Activity  Do not drive for 24 hours if you were given a medicine to help you relax (sedative).  Do not drive or use heavy machinery while taking prescription pain medicine.  Rest as told by your health care provider.  Most people can return to normal activities a few days or weeks after the procedure. Ask your health care provider what activities are safe for you. Eating and drinking  Drink enough fluid to keep your urine clear or pale yellow.  Eat a healthy, balanced diet. This includes lean proteins, whole grains, and plenty of fruits and vegetables. General instructions  Take over-the-counter and prescription medicines only as told by your health care provider.  Keep all follow-up visits as told by your health care provider. This is important. You may still need additional treatment.  Do not take baths, swim, or use a hot tub until your health  care provider approves. Shower and wash the area behind the scrotum gently.  Do not have sex for one week after the treatment, or until your health care provider approves.  If you have permanent, low-dose brachytherapy implants: ? Limit close contact with children and pregnant women for 2 months or as told by your health care provider. This is important because of the radiation that is still active in the prostate. ? You may set off radioactive sensors, such as airport screenings. Ask your health care provider for a document that explains your treatment. ? You may be instructed to use a condom during sex for the first 2 months after low-dose brachytherapy. Contact a health care provider if:  You have a fever or chills.  You do not have a bowel movement for 3-4 days after the procedure.  You have diarrhea for 3-4 days after the procedure.  You develop any new symptoms, such as problems with urinating or erectile dysfunction.  You have abdomen (abdominal) pain.  You have more blood in your urine. Get help right away if:  You cannot urinate.  There is excessive bleeding from your rectum.  You have unusual drainage coming from your rectum.  You have severe pain in the treated area that does not go away with pain medicine.  You have severe nausea or vomiting. Summary  If you have permanent, low-dose brachytherapy implants, limit close contact with children and pregnant women for 2 months or as told by your health care provider. This is important because of the  radiation that is still active in the prostate.  Talk with your health care provider about your risk of brachytherapy side effects, such as erectile dysfunction or urinary problems. Your health care provider will be able to recommend possible treatment options.  Keep all follow-up visits as told by your health care provider. This is important. You may need additional treatment. This information is not intended to replace  advice given to you by your health care provider. Make sure you discuss any questions you have with your health care provider. Document Released: 07/30/2010 Document Revised: 06/09/2017 Document Reviewed: 07/29/2016 Elsevier Patient Education  2020 [place] will be prescribed tamsulosin which is a medication to help you urinate over the next month.  You should call Dr. Lynne Logan office [phone]) if you feel you cannot empty your bladder well. Also, call if you develop fever > 101.  You will also be prescribed pain medication and should take an over the counter stool softener over the next week to avoid straining with bowel movements.  Followup with [name] and your radiation oncologist as scheduled.  You may resume Eliquis on Thursday this week.   Post Anesthesia Home Care Instructions  Activity: Get plenty of rest for the remainder of the day. A responsible adult should stay with you for 24 hours following the procedure.  For the next 24 hours, DO NOT: -Drive a car -Paediatric nurse -Drink alcoholic beverages -Take any medication unless instructed by your physician -Make any legal decisions or sign important papers.  Meals: Start with liquid foods such as gelatin or soup. Progress to regular foods as tolerated. Avoid greasy, spicy, heavy foods. If nausea and/or vomiting occur, drink only clear liquids until the nausea and/or vomiting subsides. Call your physician if vomiting continues.  Special Instructions/Symptoms: Your throat may feel dry or sore from the anesthesia or the breathing tube placed in your throat during surgery. If this causes discomfort, gargle with warm salt water. The discomfort should disappear within 24 hours.  If you had a scopolamine patch placed behind your ear for the management of post- operative nausea and/or vomiting:  1. The medication in the patch is effective for 72 hours, after which it should be removed.  Wrap patch in a tissue and  discard in the trash. Wash hands thoroughly with soap and water. 2. You may remove the patch earlier than 72 hours if you experience unpleasant side effects which may include dry mouth, dizziness or visual disturbances. 3. Avoid touching the patch. Wash your hands with soap and water after contact with the patch.

## 2019-06-04 ENCOUNTER — Encounter (HOSPITAL_BASED_OUTPATIENT_CLINIC_OR_DEPARTMENT_OTHER): Payer: Self-pay | Admitting: Urology

## 2019-06-04 NOTE — Addendum Note (Signed)
Addendum  created 06/04/19 1042 by Justice Rocher, CRNA   Charge Capture section accepted

## 2019-06-05 NOTE — Progress Notes (Signed)
  Radiation Oncology         [phone] ________________________________  Name: Trevor [name] MRN: [id number]  Date: 06/05/2019  DOB: 1951-07-03       Prostate Seed Implant  [id number], [name], MD  No ref. provider found  DIAGNOSIS: 68 y.o. gentleman with Stage T1c adenocarcinoma of the prostate with Gleason score of 3+4, and PSA of 4.96.    ICD-10-CM   1. Prostate cancer ([place])  C61 DG Chest 2 View    DG Chest 2 View    PROCEDURE: Insertion of radioactive I-125 seeds into the prostate gland.  RADIATION DOSE: 145 Gy, definitive therapy.  TECHNIQUE: Trevor [name] was brought to the operating room with the urologist. He was placed in the dorsolithotomy position. He was catheterized and a rectal tube was inserted. The perineum was shaved, prepped and draped. The ultrasound probe was then introduced into the rectum to see the prostate gland.  TREATMENT DEVICE: A needle grid was attached to the ultrasound probe stand and anchor needles were placed.  3D PLANNING: The prostate was imaged in 3D using a sagittal sweep of the prostate probe. These images were transferred to the planning computer. There, the prostate, urethra and rectum were defined on each axial reconstructed image. Then, the software created an optimized 3D plan and a few seed positions were adjusted. The quality of the plan was reviewed using [facility] information for the target and the following two organs at risk:  Urethra and Rectum.  Then the accepted plan was printed and handed off to the radiation therapist.  Under my supervision, the custom loading of the seeds and spacers was carried out and loaded into sealed vicryl sleeves.  These pre-loaded needles were then placed into the needle holder.Marland Kitchen  PROSTATE VOLUME STUDY:  Using transrectal ultrasound the volume of the prostate was verified to be 63.7 cc.  SPECIAL TREATMENT PROCEDURE/SUPERVISION AND HANDLING: The pre-loaded needles were then delivered under sagittal  guidance. A total of 25 needles were used to deposit 72 seeds in the prostate gland. The individual seed activity was 0.596 mCi.  SpaceOAR:  Yes  COMPLEX SIMULATION: At the end of the procedure, an anterior radiograph of the pelvis was obtained to document seed positioning and count. Cystoscopy was performed to check the urethra and bladder.  MICRODOSIMETRY: At the end of the procedure, the patient was emitting 0.138 mR/hr at 1 meter. Accordingly, he was considered safe for hospital discharge.  PLAN: The patient will return to the radiation oncology clinic for post implant CT dosimetry in three weeks.   ________________________________  Sheral Apley Tammi Klippel, M.D.

## 2019-06-27 ENCOUNTER — Telehealth: Payer: Self-pay | Admitting: *Deleted

## 2019-06-27 NOTE — Telephone Encounter (Signed)
CALLED PATIENT TO REMIND OF POST SEED APPTS. AND MRI FOR 06-28-19, SPOKE WITH PATIENT AND HE IS AWARE OF THESE APPTS.

## 2019-06-28 ENCOUNTER — Ambulatory Visit
Admission: RE | Admit: 2019-06-28 | Discharge: 2019-06-28 | Disposition: A | Discharge status: Home / Self Care | Payer: PPO | Source: Ambulatory Visit | Attending: Radiation Oncology | Admitting: Radiation Oncology

## 2019-06-28 ENCOUNTER — Encounter: Payer: Self-pay | Admitting: Urology

## 2019-06-28 ENCOUNTER — Other Ambulatory Visit: Payer: Self-pay

## 2019-06-28 ENCOUNTER — Ambulatory Visit: Admission: RE | Admit: 2019-06-28 | Payer: PPO | Source: Ambulatory Visit | Admitting: Radiation Oncology

## 2019-06-28 ENCOUNTER — Ambulatory Visit (HOSPITAL_COMMUNITY)
Admission: RE | Admit: 2019-06-28 | Discharge: 2019-06-28 | Disposition: A | Discharge status: Home / Self Care | Payer: PPO | Source: Ambulatory Visit | Attending: Urology | Admitting: Urology

## 2019-06-28 ENCOUNTER — Ambulatory Visit (HOSPITAL_COMMUNITY): Payer: PPO

## 2019-06-28 ENCOUNTER — Ambulatory Visit: Payer: PPO | Admitting: Radiation Oncology

## 2019-06-28 ENCOUNTER — Ambulatory Visit
Admission: RE | Admit: 2019-06-28 | Discharge: 2019-06-28 | Disposition: A | Discharge status: Home / Self Care | Payer: PPO | Source: Ambulatory Visit | Attending: Urology | Admitting: Urology

## 2019-06-28 VITALS — BP 192/95 | HR 70 | Temp 98.4°F | Resp 20

## 2019-06-28 DIAGNOSIS — Z79899 Other long term (current) drug therapy: Secondary | ICD-10-CM | POA: Insufficient documentation

## 2019-06-28 DIAGNOSIS — Z923 Personal history of irradiation: Secondary | ICD-10-CM | POA: Insufficient documentation

## 2019-06-28 DIAGNOSIS — Z7901 Long term (current) use of anticoagulants: Secondary | ICD-10-CM | POA: Diagnosis not present

## 2019-06-28 DIAGNOSIS — C61 Malignant neoplasm of prostate: Secondary | ICD-10-CM

## 2019-06-28 NOTE — Progress Notes (Signed)
Trevor [name] is here today for a post-seed follow-up. Patient has a MRI scheduled for today. Patient states that he will see his urologist on Tuesday. Nocturia x3  Hematuria None Dysuria Sometime  Stream  Moderate to weak  leakage  none urgency    sometime Bowels None Empty bladder Most of the time Patients blood pressure was elevated denies any headaches ,numbness or tingling in extremities,chest pain .Denies  Any s/s of hypertension. States that he has hypertension and that he has taken his medication. States that he is being followed by his cardiologist for his blood pressure .Blood pressure decrease some after sitting for a while . Patient states that he is anxious about having his mri today. Vitals:   06/28/19 0917 06/28/19 0932  BP: (!) 210/106 (!) 192/95  Pulse: 81 70  Resp: 20   Temp: 98.4 F (36.9 C)   SpO2: 99%

## 2019-06-28 NOTE — Progress Notes (Signed)
Radiation Oncology         (336) [phone] ________________________________  Name: Trevor [name] MRN: [id number]  Date: 06/28/2019  DOB: Jul 26, 1950  Post-Seed Follow-Up Visit Note  CC: Tisovec, [name], MD  Tisovec, [name], MD  Diagnosis:   68 y.o. gentleman with Stage T1c adenocarcinoma of the prostate with Gleason score of 3+4, and PSA of 4.96.    ICD-10-CM   1. Malignant neoplasm of prostate ([place])  C61     Interval Since Last Radiation:  3 weeks 06/03/19:  Insertion of radioactive I-125 seeds into the prostate gland; 145 Gy, definitive/boost therapy with placement of SpaceOAR gel.  Narrative:  The patient returns today for routine follow-up.  He is complaining of increased urinary frequency and urinary hesitation symptoms. He filled out a questionnaire regarding urinary function today providing and overall IPSS score of 20 characterizing his symptoms as severe with weak stream, intermittency, urgency, straining, feelings of incomplete emptying and nocturia x3/night.  However, he admits that he has seen improvement in his flow of stream with less frequency and urgency just this past week which he is quite encouraged about.  At this point, he reports nocturia x2 per night which is pretty much back to his baseline.  He specifically denies dysuria, gross hematuria, suprapubic pressure, fever, chills or night sweats.  He feels that he is emptying his bladder adequately and denies any abdominal pain, nausea, vomiting, diarrhea or constipation.  His pre-implant score was 3.  He reports a healthy appetite and is maintaining his weight.  Overall, he is quite pleased with his progress to date.  ALLERGIES:  has No Known Allergies.  Meds: Current Outpatient Medications  Medication Sig Dispense Refill  . amLODipine (NORVASC) 10 MG tablet TAKE 1 TABLET BY MOUTH EVERY DAY 30 tablet 11  . atorvastatin (LIPITOR) 10 MG tablet Take 10 mg by mouth daily at 6 PM.     . BYSTOLIC 20 MG TABS TAKE 1  TABLET BY MOUTH TWICE DAILY 60 tablet 11  . cloNIDine (CATAPRES) 0.2 MG tablet TAKE 1 TABLET BY MOUTH TWICE DAILY 60 tablet 7  . ELIQUIS 5 MG TABS tablet Take 5 mg by mouth 2 (two) times daily.    Marland Kitchen FLUZONE HIGH-DOSE QUADRIVALENT 0.7 ML SUSY     . hydrALAZINE (APRESOLINE) 50 MG tablet TAKE 1 TABLET BY MOUTH TWICE A DAY 180 tablet 4  . irbesartan-hydrochlorothiazide (AVALIDE) 300-12.5 MG tablet TAKE 1 TABLET BY MOUTH EVERY DAY 30 tablet 11  . tamsulosin (FLOMAX) 0.4 MG CAPS capsule Take 0.4 mg by mouth daily after supper.     . ALPRAZolam (XANAX) 0.5 MG tablet Take 1 tablet (0.5 mg total) by mouth as needed for anxiety (take one tablet 30 minutes prior to MRI and may repeat once, just prior to scan, if needed). (Patient not taking: Reported on 06/28/2019) 2 tablet 0  . silodosin (RAPAFLO) 4 MG CAPS capsule Take 1 capsule (4 mg total) by mouth daily. (Patient not taking: Reported on 06/28/2019) 30 capsule 0  . traMADol (ULTRAM) 50 MG tablet Take 1-2 tablets (50-100 mg total) by mouth every 6 (six) hours as needed (pain). (Patient not taking: Reported on 06/28/2019) 15 tablet 0  . traMADol (ULTRAM) 50 MG tablet Take 1-2 tablets (50-100 mg total) by mouth every 6 (six) hours as needed (pain). (Patient not taking: Reported on 06/28/2019) 15 tablet 0   No current facility-administered medications for this encounter.    Physical Findings: In general this is a well appearing  Caucasian male in no acute distress. He's alert and oriented x4 and appropriate throughout the examination. Cardiopulmonary assessment is negative for acute distress and he exhibits normal effort.   Lab Findings: Lab Results  Component Value Date   WBC 5.9 05/30/2019   HGB 17.5 (H) 05/30/2019   HCT 51.8 05/30/2019   MCV 88.2 05/30/2019   PLT 183 05/30/2019    Radiographic Findings:  Patient underwent CT imaging in our clinic for post implant dosimetry. The CT will be reviewed by [name] to confirm there is an adequate  distribution of radioactive seeds throughout the prostate gland and ensure that there are no seeds in or near the rectum. His scheduled for prostate MRI following our appointment this morning and those images will be fused with his CT images for further evaluation. We suspect the final radiation plan and dosimetry will show appropriate coverage of the prostate gland. He understands that we will call and inform him of any unexpected findings on further review of his imaging and dosimetry.  Impression/Plan: 68 y.o. gentleman with Stage T1c adenocarcinoma of the prostate with Gleason score of 3+4, and PSA of 4.96. The patient is recovering from the effects of radiation. His urinary symptoms should gradually improve over the next 4-6 months. We talked about this today. He is encouraged by his improvement already and is otherwise pleased with his outcome. We also talked about long-term follow-up for prostate cancer following seed implant. He understands that ongoing PSA determinations and digital rectal exams will help perform surveillance to rule out disease recurrence. He has a follow up appointment scheduled with [name] on 07/02/19. He understands what to expect with his PSA measures. Patient was also educated today about some of the long-term effects from radiation including a small risk for rectal bleeding and possibly erectile dysfunction. We talked about some of the general management approaches to these potential complications. However, I did encourage the patient to contact our office or return at any point if he has questions or concerns related to his previous radiation and prostate cancer.    Nicholos Johns, PA-C

## 2019-06-28 NOTE — Progress Notes (Signed)
  Radiation Oncology         [phone] ________________________________  Name: Trevor [name] MRN: [id number]  Date: 06/28/2019  DOB: 12/01/1950  COMPLEX SIMULATION NOTE  NARRATIVE:  The patient was brought to the [facility] today following prostate seed implantation approximately one month ago.  Identity was confirmed.  All relevant records and images related to the planned course of therapy were reviewed.  Then, the patient was set-up supine.  CT images were obtained.  The CT images were loaded into the planning software.  Then the prostate and rectum were contoured.  Treatment planning then occurred.  The implanted iodine 125 seeds were identified by the physics staff for projection of radiation distribution  I have requested : 3D Simulation  I have requested a DVH of the following structures: Prostate and rectum.    ________________________________  Sheral Apley Tammi Klippel, M.D.  This document serves as a record of services personally performed by Trevor Pita, MD. It was created on his behalf by Wilburn Mylar, a trained medical scribe. The creation of this record is based on the scribe's personal observations and the provider's statements to them. This document has been checked and approved by the attending provider.

## 2019-07-02 ENCOUNTER — Encounter: Payer: Self-pay | Admitting: Radiation Oncology

## 2019-07-02 DIAGNOSIS — C61 Malignant neoplasm of prostate: Secondary | ICD-10-CM | POA: Diagnosis not present

## 2019-07-11 NOTE — Progress Notes (Signed)
  Radiation Oncology         [phone] ________________________________  Name: Trevor [name] MRN: [id number]  Date: 07/02/2019  DOB: 19-Aug-1950  3D Planning Note   Prostate Brachytherapy Post-Implant Dosimetry  Diagnosis: 68 y.o. gentleman with Stage T1c adenocarcinoma of the prostate with Gleason score of 3+4, and PSA of 4.96  Narrative: On a previous date, [name] returned following prostate seed implantation for post implant planning. He underwent CT scan complex simulation to delineate the three-dimensional structures of the pelvis and demonstrate the radiation distribution.  Since that time, the seed localization, and complex isodose planning with dose volume histograms have now been completed.  Results:   Prostate Coverage - The dose of radiation delivered to the 90% or more of the prostate gland (D90) was 113.97% of the prescription dose. This exceeds our goal of greater than 90%. Rectal Sparing - The volume of rectal tissue receiving the prescription dose or higher was 0.0 cc. This falls under our thresholds tolerance of 1.0 cc.  Impression: The prostate seed implant appears to show adequate target coverage and appropriate rectal sparing.  Plan:  The patient will continue to follow with urology for ongoing PSA determinations. I would anticipate a high likelihood for local tumor control with minimal risk for rectal morbidity.  ________________________________  Sheral Apley Tammi Klippel, M.D.

## 2019-08-01 DIAGNOSIS — H1089 Other conjunctivitis: Secondary | ICD-10-CM | POA: Diagnosis not present

## 2019-08-11 ENCOUNTER — Ambulatory Visit: Payer: PPO

## 2019-08-16 ENCOUNTER — Ambulatory Visit: Payer: PPO | Attending: Internal Medicine

## 2019-08-16 DIAGNOSIS — Z23 Encounter for immunization: Secondary | ICD-10-CM | POA: Insufficient documentation

## 2019-08-16 NOTE — Progress Notes (Signed)
   Covid-19 Vaccination Clinic  Name:  Trevor [name]    MRN: [id number] DOB: April 26, 1951  08/16/2019  [name] was observed post Covid-19 immunization for 15 minutes without incidence. He was provided with Vaccine Information Sheet and instruction to access the V-Safe system.   [name] was instructed to call 911 with any severe reactions post vaccine: Marland Kitchen Difficulty breathing  . Swelling of your face and throat  . A fast heartbeat  . A bad rash all over your body  . Dizziness and weakness    Immunizations Administered    Name Date Dose VIS Date Route   Pfizer COVID-19 Vaccine 08/16/2019 11:38 AM 0.3 mL 06/21/2019 Intramuscular   Manufacturer: [place]   Lot: [id number]   [place]: [id number]

## 2019-08-22 ENCOUNTER — Ambulatory Visit: Payer: PPO

## 2019-09-10 ENCOUNTER — Ambulatory Visit: Payer: PPO | Attending: Internal Medicine

## 2019-09-10 DIAGNOSIS — Z23 Encounter for immunization: Secondary | ICD-10-CM | POA: Insufficient documentation

## 2019-09-10 NOTE — Progress Notes (Signed)
   Covid-19 Vaccination Clinic  Name:  Trevor [name]    MRN: [id number] DOB: May 12, 1951  09/10/2019  Mr. Trevor [name] was observed post Covid-19 immunization for 15 minutes without incident. He was provided with Vaccine Information Sheet and instruction to access the V-Safe system.   Mr. Trevor [name] was instructed to call 911 with any severe reactions post vaccine: Marland Kitchen Difficulty breathing  . Swelling of face and throat  . A fast heartbeat  . A bad rash all over body  . Dizziness and weakness   Immunizations Administered    Name Date Dose VIS Date Route   Pfizer COVID-19 Vaccine 09/10/2019  8:15 AM 0.3 mL 06/21/2019 Intramuscular   Manufacturer: [place]   Lot: [id number]   [place]: [id number]

## 2019-09-30 ENCOUNTER — Other Ambulatory Visit: Payer: Self-pay | Admitting: Internal Medicine

## 2019-10-01 ENCOUNTER — Other Ambulatory Visit: Payer: Self-pay | Admitting: Internal Medicine

## 2019-11-27 ENCOUNTER — Other Ambulatory Visit: Payer: Self-pay | Admitting: Internal Medicine

## 2019-11-27 ENCOUNTER — Telehealth: Payer: Self-pay | Admitting: Internal Medicine

## 2019-11-27 NOTE — Telephone Encounter (Signed)
Called patient 11/27/19 to schedule appointment, left message for patient to return call to get scheduled for follow up with Dr. Debara Pickett

## 2019-11-27 NOTE — Telephone Encounter (Signed)
  *  STAT* If patient is at the pharmacy, call can be transferred to refill team.   1. Which medications need to be refilled? (please list name of each medication and dose if known) amLODipine (NORVASC) 10 MG tablet  2. Which pharmacy/location (including street and city if local pharmacy) is medication to be sent to? [facility] [phone] - [place], [place] - [place]  3. Do they need a 30 day or 90 day supply? 30 days  Pt have appt with Dr. Debara Pickett on 01/27/20

## 2019-12-10 DIAGNOSIS — Z125 Encounter for screening for malignant neoplasm of prostate: Secondary | ICD-10-CM | POA: Diagnosis not present

## 2019-12-10 DIAGNOSIS — E7800 Pure hypercholesterolemia, unspecified: Secondary | ICD-10-CM | POA: Diagnosis not present

## 2019-12-10 DIAGNOSIS — R7302 Impaired glucose tolerance (oral): Secondary | ICD-10-CM | POA: Diagnosis not present

## 2019-12-16 DIAGNOSIS — N182 Chronic kidney disease, stage 2 (mild): Secondary | ICD-10-CM | POA: Diagnosis not present

## 2019-12-16 DIAGNOSIS — Z0000 Encounter for general adult medical examination without abnormal findings: Secondary | ICD-10-CM | POA: Diagnosis not present

## 2019-12-16 DIAGNOSIS — I129 Hypertensive chronic kidney disease with stage 1 through stage 4 chronic kidney disease, or unspecified chronic kidney disease: Secondary | ICD-10-CM | POA: Diagnosis not present

## 2019-12-16 DIAGNOSIS — R7302 Impaired glucose tolerance (oral): Secondary | ICD-10-CM | POA: Diagnosis not present

## 2019-12-16 DIAGNOSIS — E7800 Pure hypercholesterolemia, unspecified: Secondary | ICD-10-CM | POA: Diagnosis not present

## 2019-12-16 DIAGNOSIS — Z7901 Long term (current) use of anticoagulants: Secondary | ICD-10-CM | POA: Diagnosis not present

## 2019-12-16 DIAGNOSIS — D6869 Other thrombophilia: Secondary | ICD-10-CM | POA: Diagnosis not present

## 2019-12-16 DIAGNOSIS — I4891 Unspecified atrial fibrillation: Secondary | ICD-10-CM | POA: Diagnosis not present

## 2019-12-16 DIAGNOSIS — E669 Obesity, unspecified: Secondary | ICD-10-CM | POA: Diagnosis not present

## 2019-12-16 DIAGNOSIS — D692 Other nonthrombocytopenic purpura: Secondary | ICD-10-CM | POA: Diagnosis not present

## 2019-12-16 DIAGNOSIS — C61 Malignant neoplasm of prostate: Secondary | ICD-10-CM | POA: Diagnosis not present

## 2019-12-16 DIAGNOSIS — R809 Proteinuria, unspecified: Secondary | ICD-10-CM | POA: Diagnosis not present

## 2019-12-16 DIAGNOSIS — R82998 Other abnormal findings in urine: Secondary | ICD-10-CM | POA: Diagnosis not present

## 2019-12-19 ENCOUNTER — Other Ambulatory Visit: Payer: Self-pay | Admitting: Internal Medicine

## 2019-12-25 DIAGNOSIS — C61 Malignant neoplasm of prostate: Secondary | ICD-10-CM | POA: Diagnosis not present

## 2019-12-26 DIAGNOSIS — Z1212 Encounter for screening for malignant neoplasm of rectum: Secondary | ICD-10-CM | POA: Diagnosis not present

## 2020-01-08 DIAGNOSIS — C61 Malignant neoplasm of prostate: Secondary | ICD-10-CM | POA: Diagnosis not present

## 2020-01-08 DIAGNOSIS — N401 Enlarged prostate with lower urinary tract symptoms: Secondary | ICD-10-CM | POA: Diagnosis not present

## 2020-01-08 DIAGNOSIS — R351 Nocturia: Secondary | ICD-10-CM | POA: Diagnosis not present

## 2020-01-18 ENCOUNTER — Other Ambulatory Visit: Payer: Self-pay | Admitting: Internal Medicine

## 2020-01-21 ENCOUNTER — Other Ambulatory Visit: Payer: Self-pay | Admitting: Internal Medicine

## 2020-01-27 ENCOUNTER — Ambulatory Visit: Payer: PPO | Admitting: Internal Medicine

## 2020-02-17 ENCOUNTER — Other Ambulatory Visit: Payer: Self-pay | Admitting: Internal Medicine

## 2020-03-18 ENCOUNTER — Other Ambulatory Visit: Payer: Self-pay | Admitting: Internal Medicine

## 2020-03-22 ENCOUNTER — Other Ambulatory Visit: Payer: Self-pay | Admitting: Internal Medicine

## 2020-04-01 ENCOUNTER — Telehealth: Payer: Self-pay | Admitting: Internal Medicine

## 2020-04-01 NOTE — Telephone Encounter (Signed)
Spoke with patient of Dr. Debara Pickett regarding request to change visit type. He reports no acute concerns - denies chest pain, shortness of breath. He reports staying active, walking. He had a physical in June with PCP and saw urologist around same time. Advised OK to change visit type to virtual - he prefers phone call.     Patient Consent for Virtual Visit    Trevor [name] has provided verbal consent on 04/01/2020 for a virtual visit (video or telephone).   CONSENT FOR VIRTUAL VISIT FOR:  Trevor [name]  By participating in this virtual visit I agree to the following:  I hereby voluntarily request, consent and authorize [facility] and its employed or contracted physicians, physician assistants, nurse practitioners or other licensed health care professionals (the Practitioner), to provide me with telemedicine health care services (the Services") as deemed necessary by the treating Practitioner. I acknowledge and consent to receive the Services by the Practitioner via telemedicine. I understand that the telemedicine visit will involve communicating with the Practitioner through live audiovisual communication technology and the disclosure of certain medical information by electronic transmission. I acknowledge that I have been given the opportunity to request an in-person assessment or other available alternative prior to the telemedicine visit and am voluntarily participating in the telemedicine visit.  I understand that I have the right to withhold or withdraw my consent to the use of telemedicine in the course of my care at any time, without affecting my right to future care or treatment, and that the Practitioner or I may terminate the telemedicine visit at any time. I understand that I have the right to inspect all information obtained and/or recorded in the course of the telemedicine visit and may receive copies of available information for a reasonable fee.  I understand that some of the  potential risks of receiving the Services via telemedicine include:   Delay or interruption in medical evaluation due to technological equipment failure or disruption;  Information transmitted may not be sufficient (e.g. poor resolution of images) to allow for appropriate medical decision making by the Practitioner; and/or   In rare instances, security protocols could fail, causing a breach of personal health information.  Furthermore, I acknowledge that it is my responsibility to provide information about my medical history, conditions and care that is complete and accurate to the best of my ability. I acknowledge that Practitioner's advice, recommendations, and/or decision may be based on factors not within their control, such as incomplete or inaccurate data provided by me or distortions of diagnostic images or specimens that may result from electronic transmissions. I understand that the practice of medicine is not an exact science and that Practitioner makes no warranties or guarantees regarding treatment outcomes. I acknowledge that a copy of this consent can be made available to me via my patient portal ([facility]), or I can request a printed copy by calling the office of [facility].    I understand that my insurance will be billed for this visit.   I have read or had this consent read to me.  I understand the contents of this consent, which adequately explains the benefits and risks of the Services being provided via telemedicine.   I have been provided ample opportunity to ask questions regarding this consent and the Services and have had my questions answered to my satisfaction.  I give my informed consent for the services to be provided through the use of telemedicine in my medical care

## 2020-04-01 NOTE — Telephone Encounter (Signed)
New message:     Patient calling to see if can have vv the day of his apt. Please call patient.

## 2020-04-06 ENCOUNTER — Encounter: Payer: Self-pay | Admitting: Internal Medicine

## 2020-04-06 ENCOUNTER — Telehealth (INDEPENDENT_AMBULATORY_CARE_PROVIDER_SITE_OTHER): Payer: PPO | Admitting: Internal Medicine

## 2020-04-06 VITALS — BP 138/81 | HR 66 | Ht 71.0 in | Wt 213.0 lb

## 2020-04-06 DIAGNOSIS — I10 Essential (primary) hypertension: Secondary | ICD-10-CM | POA: Diagnosis not present

## 2020-04-06 DIAGNOSIS — I4891 Unspecified atrial fibrillation: Secondary | ICD-10-CM | POA: Diagnosis not present

## 2020-04-06 DIAGNOSIS — E785 Hyperlipidemia, unspecified: Secondary | ICD-10-CM

## 2020-04-06 MED ORDER — CLONIDINE HCL 0.2 MG PO TABS: 0.2000 mg | ORAL_TABLET | Freq: Two times a day (BID) | ORAL | 3 refills | Status: DC

## 2020-04-06 MED ORDER — BYSTOLIC 20 MG PO TABS: 1.0000 | ORAL_TABLET | Freq: Two times a day (BID) | ORAL | 3 refills | Status: DC

## 2020-04-06 MED ORDER — AMLODIPINE BESYLATE 10 MG PO TABS: 10.0000 mg | ORAL_TABLET | Freq: Every day | ORAL | 3 refills | Status: DC

## 2020-04-06 MED ORDER — IRBESARTAN-HYDROCHLOROTHIAZIDE 300-12.5 MG PO TABS: 1.0000 | ORAL_TABLET | Freq: Every day | ORAL | 3 refills | Status: DC

## 2020-04-06 NOTE — Patient Instructions (Signed)

## 2020-04-06 NOTE — Progress Notes (Signed)
Virtual Visit via Video Note   This visit type was conducted due to national recommendations for restrictions regarding the COVID-19 Pandemic (e.g. social distancing) in an effort to limit this patient's exposure and mitigate transmission in our community.  Due to his co-morbid illnesses, this patient is at least at moderate risk for complications without adequate follow up.  This format is felt to be most appropriate for this patient at this time.  All issues noted in this document were discussed and addressed.  A limited physical exam was performed with this format.  Please refer to the patient's chart for his consent to telehealth for [facility]   Evaluation Performed:  Telephone follow-up visit  Date:  04/06/2020   ID:  Trevor [name], DOB Mar 25, 1951, MRN [id number]  Patient Location:  [place] [address]  Provider location:   [address] [place], [place] [address]  PCP:  Haywood Pao, MD  Cardiologist:  No primary care provider on file. Electrophysiologist:  None   Chief Complaint:  No complaints  History of Present Illness:    Trevor [name] is a 69 y.o. male who presents via audio/video conferencing for a telehealth visit today.  Trevor [name] was seen today for video follow-up.  He denies any chest pain or worsening shortness of breath.  He has no somatic A. fib.  He has no bleeding problems on Eliquis.  His blood pressure is been well controlled.  His abs were recently drawn for an upcoming visit with his primary care provider.  His total cholesterol is 119 and LDL was 55.  Hemoglobin A1c was 5.6.  The only abnormality was that his PSA was elevated at 4.77, up from 3+ according to our labs in 2018.  He does say that he had a history of what sounds like may have been prostatitis in the past.  He is not followed by a urologist currently.  04/06/2020  Trevor [name] seen today via telephone visit.  This is for an annual follow-up.  Overall he  is doing well.  He denies any symptomatic A. fib.  He has had no bleeding problems on Eliquis.  Blood pressure is decently controlled at 562 systolic on 4 different medications.  He has been struggling with prostate cancer but had radiation seed implant and his PSA is low if not undetectable.  Overall he feels well.  He exercises regularly.  He has no chest pain shortness of breath or exercise intolerance.  The patient does not have symptoms concerning for COVID-19 infection (fever, chills, cough, or new SHORTNESS OF BREATH).    Prior CV studies:   The following studies were reviewed today:  Chart review  PMHx:  Past Medical History:  Diagnosis Date  . Allergy    seasonal  . Dyslipidemia   . Dysrhythmia    ATRIAL FIB  . Hyperlipidemia    preventitive due to brothers heart issues in his  9's  . Hypertension   . Prostate cancer [facility])     Past Surgical History:  Procedure Laterality Date  . CARDIOVERSION N/A 02/01/2017   Procedure: CARDIOVERSION;  Surgeon: Pixie Casino, MD;  Location: [facility] ENDOSCOPY;  Service: Cardiovascular;  Laterality: N/A;  . NM [place]   persantine myoview - normal perfusion, EF 64%, low risk scan  . PROSTATE BIOPSY  04/04/2019  . RADIOACTIVE SEED IMPLANT N/A 06/03/2019   Procedure: RADIOACTIVE SEED IMPLANT/BRACHYTHERAPY IMPLANT WITH CYSTOSCOPY;  Surgeon: Raynelle Bring,  MD;  Location: [facility];  Service: Urology;  Laterality: N/A;  . SPACE OAR INSTILLATION N/A 06/03/2019   Procedure: SPACE OAR INSTILLATION;  Surgeon: Raynelle Bring, MD;  Location: [facility];  Service: Urology;  Laterality: N/A;    FAMHx:  Family History  Problem Relation Age of Onset  . Breast cancer Mother   . Heart disease Father   . Prostate cancer Father   . Kidney disease Father   . Heart disease Maternal Grandmother   . Heart disease Maternal Grandfather   . Heart attack Paternal Grandmother   . Lupus Paternal  Grandfather   . Heart disease Brother        40's  . Colon cancer Neg Hx     SOCHx:   reports that he has been smoking cigars. He has quit using smokeless tobacco. He reports that he does not drink alcohol and does not use drugs.  ALLERGIES:  No Known Allergies  MEDS:  Current Meds  Medication Sig  . amLODipine (NORVASC) 10 MG tablet TAKE 1 TABLET BY MOUTH DAILY. PLEASE KEEP UPCOMING APPOINMENT FOR FUTURE REFILLS  . atorvastatin (LIPITOR) 10 MG tablet Take 10 mg by mouth daily at 6 PM.   . BYSTOLIC 20 MG TABS TAKE 1 TABLET BY MOUTH TWICE DAILY  . cloNIDine (CATAPRES) 0.2 MG tablet TAKE 1 TABLET(0.2 MG) BY MOUTH TWICE DAILY  . ELIQUIS 5 MG TABS tablet TAKE 1 TABLET BY MOUTH TWICE DAILY  . hydrALAZINE (APRESOLINE) 50 MG tablet Take 1 tablet (50 mg total) by mouth 2 (two) times daily.  . irbesartan-hydrochlorothiazide (AVALIDE) 300-12.5 MG tablet TAKE 1 TABLET BY MOUTH DAILY  . tamsulosin (FLOMAX) 0.4 MG CAPS capsule Take 0.4 mg by mouth daily after supper.      ROS: Pertinent items noted in HPI and remainder of comprehensive ROS otherwise negative.  Labs/Other Tests and Data Reviewed:    Recent Labs: 05/30/2019: ALT 36; BUN 20; Creatinine, Ser 0.77; Hemoglobin 17.5; Platelets 183; Potassium 3.3; Sodium 144   Recent Lipid Panel No results found for: CHOL, TRIG, HDL, CHOLHDL, LDLCALC, LDLDIRECT  Wt Readings from Last 3 Encounters:  04/06/20 213 lb (96.6 kg)  06/03/19 215 lb 3 oz (97.6 kg)  04/30/19 215 lb (97.5 kg)     Exam:    Vital Signs:  BP 138/81   Pulse 66   Ht 5\' 11"  (1.803 m)   Wt 213 lb (96.6 kg)   BMI 29.71 kg/m    Deferred  ASSESSMENT & PLAN:    1. Paroxysmal atrial fibrillation - CHADSVASC 2 on Eliquis 2. Hypertension 3. Dyslipidemia 4. Elevated PSA  Trevor [name] continues to do well and is asymptomatic with regards to atrial fibrillation.  He denies any bleeding issues on Eliquis.  Blood pressure is decently controlled given the number of medications  he is on.  He has a dyslipidemia with recent LDL of 61.  His PSA been elevated but he underwent radiation seed implants and now it is much improved.  Overall he is feeling well.  Plan follow-up with me annually or sooner as necessary.  COVID-19 Education: The signs and symptoms of COVID-19 were discussed with the patient and how to seek care for testing (follow up with PCP or arrange E-visit).  The importance of social distancing was discussed today.  Patient Risk:   After full review of this patients clinical status, I feel that they are at least moderate risk at this time.  Time:   Today, I  have spent 25 minutes with the patient with telehealth technology discussing tension, dyslipidemia, atrial fibrillation, elevated PSA.     Medication Adjustments/Labs and Tests Ordered: Current medicines are reviewed at length with the patient today.  Concerns regarding medicines are outlined above.   Tests Ordered: No orders of the defined types were placed in this encounter.   Medication Changes: No orders of the defined types were placed in this encounter.   Disposition:  in 1 year(s)  Pixie Casino, MD, [facility], [place] Director of the Advanced Lipid Disorders &  Cardiovascular Risk Reduction Clinic Diplomate of the American Board of Clinical Lipidology Attending Cardiologist  Direct Dial: [phone]  Fax: [phone]  Website:  [email or web address]  Pixie Casino, MD  04/06/2020 8:25 AM

## 2020-04-17 ENCOUNTER — Other Ambulatory Visit: Payer: Self-pay | Admitting: Internal Medicine

## 2020-05-09 ENCOUNTER — Other Ambulatory Visit: Payer: Self-pay | Admitting: Internal Medicine

## 2020-06-10 ENCOUNTER — Other Ambulatory Visit: Payer: Self-pay | Admitting: Internal Medicine

## 2020-08-03 DIAGNOSIS — C61 Malignant neoplasm of prostate: Secondary | ICD-10-CM | POA: Diagnosis not present

## 2020-09-07 ENCOUNTER — Other Ambulatory Visit: Payer: Self-pay | Admitting: Internal Medicine

## 2020-09-07 NOTE — Telephone Encounter (Signed)
31m, 96.6kg, lovw/hilty 04/06/20, scr 0.9 12/10/19

## 2020-11-01 ENCOUNTER — Other Ambulatory Visit: Payer: Self-pay | Admitting: Internal Medicine

## 2020-12-14 DIAGNOSIS — R7302 Impaired glucose tolerance (oral): Secondary | ICD-10-CM | POA: Diagnosis not present

## 2020-12-14 DIAGNOSIS — E7800 Pure hypercholesterolemia, unspecified: Secondary | ICD-10-CM | POA: Diagnosis not present

## 2020-12-14 DIAGNOSIS — Z0000 Encounter for general adult medical examination without abnormal findings: Secondary | ICD-10-CM | POA: Diagnosis not present

## 2020-12-14 DIAGNOSIS — Z125 Encounter for screening for malignant neoplasm of prostate: Secondary | ICD-10-CM | POA: Diagnosis not present

## 2020-12-14 IMAGING — CR DG CHEST 2V
2 series · 2 of 2 positions shown · non-contrast
Comparison: None.

CLINICAL DATA: Preoperative assessment for prostate seed implant
placement. History of prostate carcinoma

EXAM:
CHEST - 2 VIEW

[w chest pa]
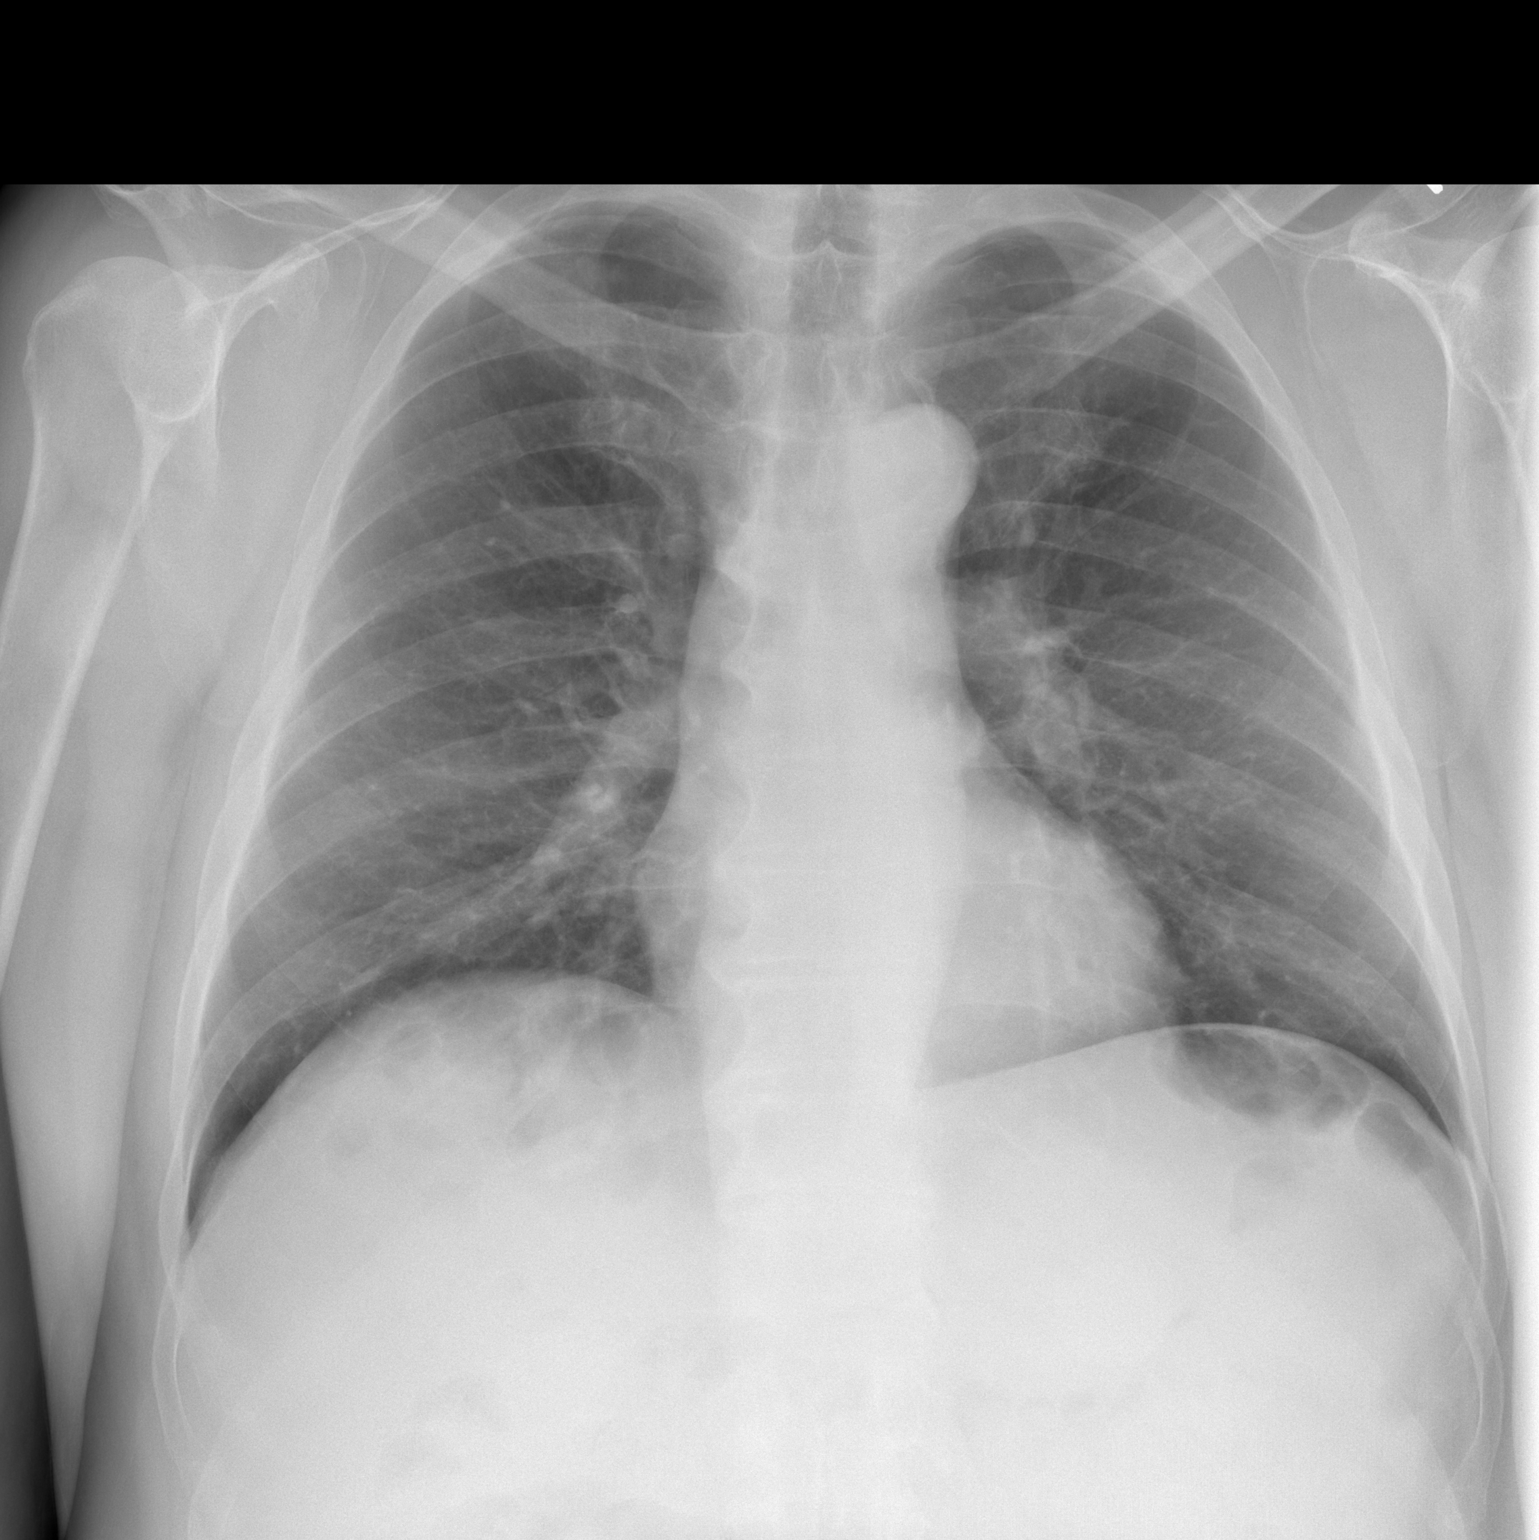

[w chest lat]
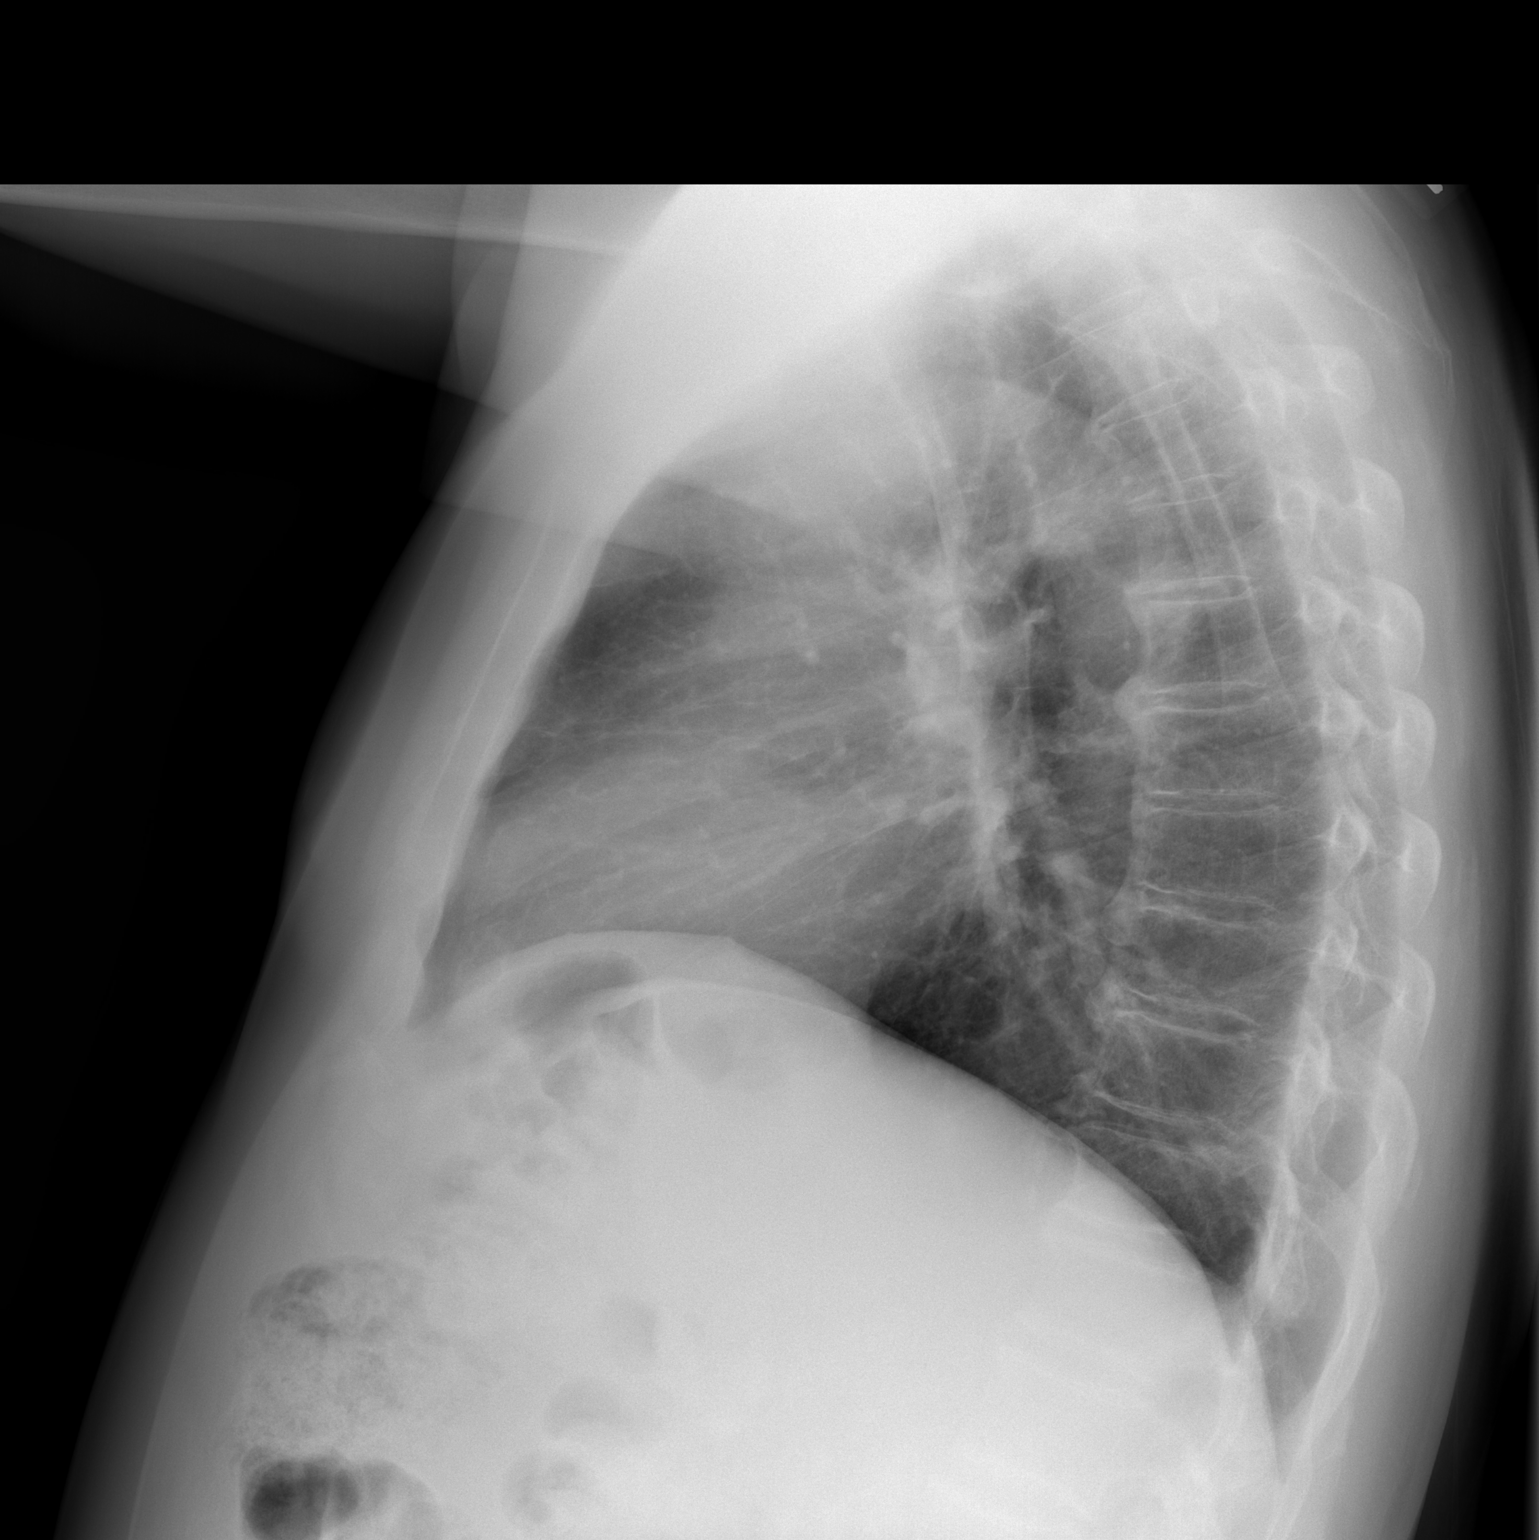

[2 of 2 positions shown; findings below may reference images not displayed]

FINDINGS: Lungs are clear. Heart size and pulmonary vascularity are normal. No
adenopathy. There is degenerative change in the thoracic spine. No
blastic or lytic bone lesions.
IMPRESSION: No edema or consolidation.  No evident neoplastic focus.

## 2020-12-21 DIAGNOSIS — N182 Chronic kidney disease, stage 2 (mild): Secondary | ICD-10-CM | POA: Diagnosis not present

## 2020-12-21 DIAGNOSIS — Z0000 Encounter for general adult medical examination without abnormal findings: Secondary | ICD-10-CM | POA: Diagnosis not present

## 2020-12-21 DIAGNOSIS — E7800 Pure hypercholesterolemia, unspecified: Secondary | ICD-10-CM | POA: Diagnosis not present

## 2020-12-21 DIAGNOSIS — D6869 Other thrombophilia: Secondary | ICD-10-CM | POA: Diagnosis not present

## 2020-12-21 DIAGNOSIS — I480 Paroxysmal atrial fibrillation: Secondary | ICD-10-CM | POA: Diagnosis not present

## 2020-12-21 DIAGNOSIS — D692 Other nonthrombocytopenic purpura: Secondary | ICD-10-CM | POA: Diagnosis not present

## 2020-12-21 DIAGNOSIS — R809 Proteinuria, unspecified: Secondary | ICD-10-CM | POA: Diagnosis not present

## 2020-12-21 DIAGNOSIS — Z1331 Encounter for screening for depression: Secondary | ICD-10-CM | POA: Diagnosis not present

## 2020-12-21 DIAGNOSIS — E669 Obesity, unspecified: Secondary | ICD-10-CM | POA: Diagnosis not present

## 2020-12-21 DIAGNOSIS — R7302 Impaired glucose tolerance (oral): Secondary | ICD-10-CM | POA: Diagnosis not present

## 2020-12-21 DIAGNOSIS — Z6830 Body mass index (BMI) 30.0-30.9, adult: Secondary | ICD-10-CM | POA: Diagnosis not present

## 2020-12-21 DIAGNOSIS — Z1339 Encounter for screening examination for other mental health and behavioral disorders: Secondary | ICD-10-CM | POA: Diagnosis not present

## 2020-12-21 DIAGNOSIS — I129 Hypertensive chronic kidney disease with stage 1 through stage 4 chronic kidney disease, or unspecified chronic kidney disease: Secondary | ICD-10-CM | POA: Diagnosis not present

## 2020-12-21 DIAGNOSIS — Z7901 Long term (current) use of anticoagulants: Secondary | ICD-10-CM | POA: Diagnosis not present

## 2020-12-21 DIAGNOSIS — C61 Malignant neoplasm of prostate: Secondary | ICD-10-CM | POA: Diagnosis not present

## 2020-12-22 ENCOUNTER — Telehealth: Payer: Self-pay | Admitting: *Deleted

## 2020-12-22 NOTE — Telephone Encounter (Signed)
error 

## 2020-12-31 ENCOUNTER — Other Ambulatory Visit: Payer: Self-pay | Admitting: Internal Medicine

## 2021-01-27 DIAGNOSIS — Z1211 Encounter for screening for malignant neoplasm of colon: Secondary | ICD-10-CM | POA: Diagnosis not present

## 2021-01-27 DIAGNOSIS — Z1212 Encounter for screening for malignant neoplasm of rectum: Secondary | ICD-10-CM | POA: Diagnosis not present

## 2021-02-01 LAB — COLOGUARD: COLOGUARD: NEGATIVE

## 2021-03-07 ENCOUNTER — Other Ambulatory Visit: Payer: Self-pay | Admitting: Internal Medicine

## 2021-03-08 NOTE — Telephone Encounter (Signed)
Prescription refill request for Eliquis received. Indication:atrial fib Last office visit:9/21 JSC:BIPJR labs Age: 71 Weight:96.6 kg  Prescription refilled

## 2021-04-02 ENCOUNTER — Other Ambulatory Visit: Payer: Self-pay | Admitting: Internal Medicine

## 2021-04-02 NOTE — Telephone Encounter (Signed)
Prescription refill request for Eliquis received. Indication:atrial fib Last office visit:9/21 JSC:BIPJR labs Age: 71 Weight:96.6 kg  Prescription refilled

## 2021-04-29 ENCOUNTER — Other Ambulatory Visit: Payer: Self-pay | Admitting: Internal Medicine

## 2021-04-29 NOTE — Telephone Encounter (Signed)
Prescription refill request for Eliquis received. Indication:Afib Last office visit: needs appt MMN:OTRRN labs Age: 70 Weight:96.6 kg  Prescription refilled

## 2021-04-30 ENCOUNTER — Other Ambulatory Visit: Payer: Self-pay | Admitting: Internal Medicine

## 2021-05-03 ENCOUNTER — Other Ambulatory Visit: Payer: Self-pay | Admitting: Internal Medicine

## 2021-05-13 DIAGNOSIS — C61 Malignant neoplasm of prostate: Secondary | ICD-10-CM | POA: Diagnosis not present

## 2021-05-28 ENCOUNTER — Other Ambulatory Visit: Payer: Self-pay | Admitting: Internal Medicine

## 2021-05-30 ENCOUNTER — Other Ambulatory Visit: Payer: Self-pay | Admitting: Internal Medicine

## 2021-05-31 ENCOUNTER — Other Ambulatory Visit: Payer: Self-pay | Admitting: Internal Medicine

## 2021-06-12 ENCOUNTER — Other Ambulatory Visit: Payer: Self-pay | Admitting: Internal Medicine

## 2021-06-15 ENCOUNTER — Other Ambulatory Visit: Payer: Self-pay | Admitting: Internal Medicine

## 2021-06-15 NOTE — Telephone Encounter (Signed)
Prescription was sent earlier to [facility]- by [facility]

## 2021-06-15 NOTE — Telephone Encounter (Signed)
Left message on patient voicemail - patient will need to call back to schedule an appointment.  Before next refill.

## 2021-06-15 NOTE — Telephone Encounter (Signed)
Prescription refill request for Eliquis received. Indication:Afib  Last office visit:04/06/20 ([facility])  Scr: 0.9 (12/10/19)  Age: 70 Weight: 96.6kg   Pt overdue for labs and cardiology office visit. Message sent to schedulers.

## 2021-06-15 NOTE — Telephone Encounter (Addendum)
Prescription refill request for Eliquis received. Indication: afib  Last office visit: 04/06/2020, [facility] Scr:0.9, 12/2019 Age: 70 yo  Weight: 96.6 kg   Overdue for office visit and for labs. Pt's PCP could have labs- office closed at this time. Office will need to be called tomorrow for copy.   Pt has multiple refill encounters open today. Msg was sent to the schedulers earlier today about making an appointment.   Called and spoke to pt who stated that he will call tomorrow to make and appointment with the cardiologist. He did get blood work done at his PCP office and that he has enough Eliquis to get him to the weekend.   Informed pt that once he gets the appointment scheduled and we get the updated blood work we can send in the refill. However, Eliquis is a medication that we do not want him to miss or run out of.

## 2021-06-16 ENCOUNTER — Other Ambulatory Visit: Payer: Self-pay

## 2021-06-16 ENCOUNTER — Telehealth: Payer: Self-pay | Admitting: Internal Medicine

## 2021-06-16 ENCOUNTER — Telehealth: Payer: Self-pay

## 2021-06-16 MED ORDER — APIXABAN 5 MG PO TABS: 5.0000 mg | ORAL_TABLET | Freq: Two times a day (BID) | ORAL | 1 refills | Status: DC

## 2021-06-16 NOTE — Telephone Encounter (Signed)
Left message for Dr Loren Racer office to Fax lab results.

## 2021-06-16 NOTE — Telephone Encounter (Signed)
*  STAT* If patient is at the pharmacy, call can be transferred to refill team.   1. Which medications need to be refilled? (please list name of each medication and dose if known) apixaban (ELIQUIS) 5 MG TABS tablet  2. Which pharmacy/location (including street and city if local pharmacy) is medication to be sent to? [facility] [phone] - [place], [place] - [place]  3. Do they need a 30 day or 90 day supply? [place]

## 2021-06-16 NOTE — Telephone Encounter (Signed)
Left PCP office message to Fax lab results to our office

## 2021-06-29 ENCOUNTER — Other Ambulatory Visit: Payer: Self-pay | Admitting: Internal Medicine

## 2021-07-06 ENCOUNTER — Other Ambulatory Visit: Payer: Self-pay | Admitting: Internal Medicine

## 2021-07-07 ENCOUNTER — Other Ambulatory Visit: Payer: Self-pay | Admitting: Internal Medicine

## 2021-07-20 NOTE — Progress Notes (Deleted)
Cardiology Office Note:    Date:  07/20/2021   ID:  Trevor [name], DOB 1951/03/09, MRN [id number]  PCP:  Haywood Pao, MD [place] Cardiologist: Pixie Casino, MD   Reason for visit: 1 year follow-up  History of Present Illness:    Trevor [name] is a 71 y.o. male with a hx of atrial fibrillation (DCCV in 2018), hyperlipidemia, hypertension & prostate cancer.  He last saw Dr. Debara Pickett via telemedicine visit in September 2021.  He was doing well and asymptomatic with his atrial fibrillation.  Today, *** Check if needs labs  Atrial fibrillation -*** -Continue Eliquis for stroke prevention  Hypertension -*** -Goal BP is <130/80.  Recommend DASH diet (high in vegetables, fruits, low-fat dairy products, whole grains, poultry, fish, and nuts and low in sweets, sugar-sweetened beverages, and red meats), salt restriction and increase physical activity.  Hyperlipidemia -*** -Discussed cholesterol lowering diets - Mediterranean diet, DASH diet, vegetarian diet, low-carbohydrate diet and avoidance of trans fats.  Discussed healthier choice substitutes.  Nuts, high-fiber foods, and fiber supplements may also improve lipids.    Disposition - Follow-up in ***    Past Medical History:  Diagnosis Date   Allergy    seasonal   Dyslipidemia    Dysrhythmia    ATRIAL FIB   Hyperlipidemia    preventitive due to brothers heart issues in his  38's   Hypertension    Prostate cancer [facility])     Past Surgical History:  Procedure Laterality Date   CARDIOVERSION N/A 02/01/2017   Procedure: CARDIOVERSION;  Surgeon: Pixie Casino, MD;  Location: [facility];  Service: Cardiovascular;  Laterality: N/A;   NM MYOCAR [place]   persantine myoview - normal perfusion, EF 64%, low risk scan   PROSTATE BIOPSY  04/04/2019   RADIOACTIVE SEED IMPLANT N/A 06/03/2019   Procedure: RADIOACTIVE SEED IMPLANT/BRACHYTHERAPY IMPLANT WITH CYSTOSCOPY;  Surgeon: Raynelle Bring,  MD;  Location: [place];  Service: Urology;  Laterality: N/A;   SPACE OAR INSTILLATION N/A 06/03/2019   Procedure: SPACE OAR INSTILLATION;  Surgeon: Raynelle Bring, MD;  Location: [facility];  Service: Urology;  Laterality: N/A;    Current Medications: No outpatient medications have been marked as taking for the 07/22/21 encounter (Appointment) with Warren Lacy, PA-C.     Allergies:   Patient has no known allergies.   Social History   Socioeconomic History   Marital status: Married    Spouse name: Not on file   Number of children: 3   Years of education: B.S.   Highest education level: Not on file  Occupational History    Comment: full time  Tobacco Use   Smoking status: Some Days    Types: Cigars   Smokeless tobacco: Former   Tobacco comments:    OCC CIGAR  Vaping Use   Vaping Use: Never used  Substance and Sexual Activity   Alcohol use: No   Drug use: No   Sexual activity: Not Currently  Other Topics Concern   Not on file  Social History Narrative   Not on file   Social Determinants of Health   Financial Resource Strain: Not on file  Food Insecurity: Not on file  Transportation Needs: Not on file  Physical Activity: Not on file  Stress: Not on file  Social Connections: Not on file     Family History: The patient's family history includes Breast cancer in his mother; Heart attack in his  paternal grandmother; Heart disease in his brother, father, maternal grandfather, and maternal grandmother; Kidney disease in his father; Lupus in his paternal grandfather; Prostate cancer in his father. There is no history of Colon cancer.  ROS:   Please see the history of present illness.     EKGs/Labs/Other Studies Reviewed:    EKG:  The ekg ordered today demonstrates ***  Recent Labs: No results found for requested labs within last 8760 hours.   Recent Lipid Panel No results found for: CHOL, TRIG, HDL, LDLCALC,  LDLDIRECT  Physical Exam:    VS:  There were no vitals taken for this visit.   No data found.  Wt Readings from Last 3 Encounters:  04/06/20 213 lb (96.6 kg)  06/03/19 215 lb 3 oz (97.6 kg)  04/30/19 215 lb (97.5 kg)     GEN: *** Well nourished, well developed in no acute distress HEENT: Normal NECK: No JVD; No carotid bruits CARDIAC: ***RRR, no murmurs, rubs, gallops RESPIRATORY:  Clear to auscultation without rales, wheezing or rhonchi  ABDOMEN: Soft, non-tender, non-distended MUSCULOSKELETAL: No edema; No deformity  SKIN: Warm and dry NEUROLOGIC:  Alert and oriented PSYCHIATRIC:  Normal affect     ASSESSMENT AND PLAN   ***   {Are you ordering a CV Procedure (e.g. stress test, cath, DCCV, TEE, etc)?   Press F2        :[id number]}    Medication Adjustments/Labs and Tests Ordered: Current medicines are reviewed at length with the patient today.  Concerns regarding medicines are outlined above.  No orders of the defined types were placed in this encounter.  No orders of the defined types were placed in this encounter.   There are no Patient Instructions on file for this visit.   Signed, Warren Lacy, PA-C  07/20/2021 9:39 PM    [facility]

## 2021-07-22 ENCOUNTER — Ambulatory Visit: Payer: PPO | Admitting: Physician Assistant

## 2021-07-22 DIAGNOSIS — I4891 Unspecified atrial fibrillation: Secondary | ICD-10-CM

## 2021-07-22 DIAGNOSIS — I10 Essential (primary) hypertension: Secondary | ICD-10-CM

## 2021-07-22 DIAGNOSIS — E785 Hyperlipidemia, unspecified: Secondary | ICD-10-CM

## 2021-07-26 ENCOUNTER — Other Ambulatory Visit: Payer: Self-pay | Admitting: Internal Medicine

## 2021-07-29 ENCOUNTER — Other Ambulatory Visit: Payer: Self-pay | Admitting: Internal Medicine

## 2021-08-10 ENCOUNTER — Other Ambulatory Visit: Payer: Self-pay | Admitting: Internal Medicine

## 2021-08-10 NOTE — Telephone Encounter (Signed)
Prescription refill request for Eliquis received. Indication: afib  Last office visit: 04/06/2020, [place] Scr: 0.9 (12/10/2019) Age: 71 yo  Weight: 96.6 kg   Pt is due to see Jory Sims. 09/03/2021. Put on appointment note for pt to get updated blood work.   Also, called pt's PCP to see if they have any blood work. LMOM for them to fax over copy of blood work if they have any,

## 2021-08-10 NOTE — Telephone Encounter (Signed)
Scr: 0.9  12/14/2020 from PCP office.

## 2021-08-23 ENCOUNTER — Other Ambulatory Visit: Payer: Self-pay | Admitting: Internal Medicine

## 2021-09-02 NOTE — Progress Notes (Signed)
Cardiology Office Note:    Date:  09/03/2021   ID:  [name], DOB 1950-11-04, MRN [id number]  PCP:  Trevor Pao, MD   [facility] Providers Cardiologist:  Trevor Casino, MD     Referring MD: Trevor Pao, MD   Chief Complaint: annual follow-up PAF, HTN  History of Present Illness:    Trevor [name] is a 71 y.o. male with a hx of atrial fibrillation on chronic anticoagulation, HTN, dyslipidemia, and prostate cancer.   He had myoveiw in 2011 that revealed normal perfusion, EF 64%, low risk. He was found to be in atrial fibrillation in June 2018. He underwent successful cardioversion in July 2018. Has had history of resistant hypertension on multiple agents.   He was last seen via telemedicine visit with Dr. Debara [name] on 04/06/20. There was no change in his treatment plan and one year follow-up was recommended.  Today, he is here alone for follow-up.  He reports he felt some fluttering in his chest a couple of weeks ago.  He denies palpitations.  Denies activity limitations.  Is active at home doing yard work and walks 30 minutes daily or works out on [employer], community. Stays active with grandchildren and traveling with his wife. He denies chest pain, shortness of breath, lower extremity edema, fatigue, palpitations, melena, hematuria, hemoptysis, diaphoresis, weakness, presyncope, syncope, orthopnea, and PND. He overall feels well and denies cardiac complaints.    Past Medical History:  Diagnosis Date   Allergy    seasonal   Dyslipidemia    Dysrhythmia    ATRIAL FIB   Hyperlipidemia    preventitive due to brothers heart issues in his  79's   Hypertension    Prostate cancer [facility])     Past Surgical History:  Procedure Laterality Date   CARDIOVERSION N/A 02/01/2017   Procedure: CARDIOVERSION;  Surgeon: Trevor Casino, MD;  Location: [place] [facility];  Service: Cardiovascular;  Laterality: N/A;   NM MYOCAR [place]   persantine myoview -  normal perfusion, EF 64%, low risk scan   PROSTATE BIOPSY  04/04/2019   RADIOACTIVE SEED IMPLANT N/A 06/03/2019   Procedure: RADIOACTIVE SEED IMPLANT/BRACHYTHERAPY IMPLANT WITH CYSTOSCOPY;  Surgeon: Trevor Bring, MD;  Location: [place];  Service: Urology;  Laterality: N/A;   SPACE OAR INSTILLATION N/A 06/03/2019   Procedure: SPACE OAR INSTILLATION;  Surgeon: Trevor Bring, MD;  Location: [facility];  Service: Urology;  Laterality: N/A;    Current Medications: Current Meds  Medication Sig   cloNIDine (CATAPRES) 0.2 MG tablet Take 1 tablet (0.2 mg total) by mouth 2 (two) times daily. **Patient need appointment for future refill**   hydrALAZINE (APRESOLINE) 50 MG tablet Take 1 tablet (50 mg total) by mouth 2 (two) times daily. KEEP UPCOMING APPOINTMENT FOR FUTURE REFILLS   hydrochlorothiazide (HYDRODIURIL) 25 MG tablet Take 1 tablet (25 mg total) by mouth daily.   irbesartan (AVAPRO) 300 MG tablet Take 1 tablet (300 mg total) by mouth daily.   tamsulosin (FLOMAX) 0.4 MG CAPS capsule Take 0.4 mg by mouth daily after supper.    [DISCONTINUED] amLODipine (NORVASC) 10 MG tablet TAKE 1 TABLET BY MOUTH DAILY   [DISCONTINUED] apixaban (ELIQUIS) 5 MG TABS tablet TAKE 1 TABLET(5 MG) BY MOUTH TWICE DAILY   [DISCONTINUED] atorvastatin (LIPITOR) 10 MG tablet Take 10 mg by mouth daily at 6 PM.    [DISCONTINUED] irbesartan-hydrochlorothiazide (AVALIDE) 300-12.5 MG tablet TAKE 1 TABLET BY MOUTH DAILY   [  DISCONTINUED] Nebivolol HCl 20 MG TABS TAKE 1 TABLET BY MOUTH TWICE DAILY     Allergies:   Patient has no known allergies.   Social History   Socioeconomic History   Marital status: Married    Spouse name: Not on file   Number of children: 3   Years of education: B.S.   Highest education level: Not on file  Occupational History    Comment: full time  Tobacco Use   Smoking status: Some Days    Types: Cigars   Smokeless tobacco: Former   Tobacco comments:     OCC CIGAR  Vaping Use   Vaping Use: Never used  Substance and Sexual Activity   Alcohol use: No   Drug use: No   Sexual activity: Not Currently  Other Topics Concern   Not on file  Social History Narrative   Not on file   Social Determinants of Health   Financial Resource Strain: Not on file  Food Insecurity: Not on file  Transportation Needs: Not on file  Physical Activity: Not on file  Stress: Not on file  Social Connections: Not on file     Family History: The patient's family history includes Breast cancer in his mother; Heart attack in his paternal grandmother; Heart disease in his brother, father, maternal grandfather, and maternal grandmother; Kidney disease in his father; Lupus in his paternal grandfather; Prostate cancer in his father. There is no history of Colon cancer.  ROS:   Please see the history of present illness.  All other systems reviewed and are negative.  Labs/Other Studies Reviewed:    The following studies were reviewed today:  Echo 6/18 Left ventricle:  The cavity size was normal. Wall thickness was  increased in a pattern of moderate LVH. Systolic function was  normal. The estimated ejection fraction was in the range of 60% to  65%. Wall motion was normal; there were no regional wall motion  abnormalities. The transmitral flow pattern was normal. The  deceleration time of the early transmitral flow velocity was  normal. The pulmonary vein flow pattern was normal. The tissue  Doppler parameters were normal. Left ventricular diastolic function  parameters were normal.  Aortic valve:   Trileaflet; normal thickness leaflets. Mobility was  not restricted.  Doppler:  Transvalvular velocity was within the  normal range. There was no stenosis. There was trivial  regurgitation.  Aorta: The aorta was normal, not dilated, and non-diseased. Aortic  root: The aortic root was normal in size.  Mitral valve:   Mildly thickened leaflets . Mobility was not   restricted.  Doppler:  Transvalvular velocity was within the normal  range. There was no evidence for stenosis. There was trivial  regurgitation.    Peak gradient (D): 7 mm Hg.  Left atrium:  The atrium was moderately dilated.  Atrial septum:  No defect or patent foramen ovale was identified.  Right ventricle:  The cavity size was normal. Wall thickness was  normal. Systolic function was normal.  Pulmonic valve:    Doppler:  Transvalvular velocity was within the  normal range. There was no evidence for stenosis. There was trivial  regurgitation.  Tricuspid valve:   Structurally normal valve.    Doppler:  Transvalvular velocity was within the normal range. There was mild  regurgitation.  Pulmonary artery:   The main pulmonary artery was normal-sized.  Systolic pressure was within the normal range.  Right atrium:  The atrium was normal in size.  Pericardium: The pericardium was  normal in appearance. There was  no pericardial effusion.  Systemic veins:  Inferior vena cava: The vessel was normal in size. The  respirophasic diameter changes were in the normal range (>= 50%),  consistent with normal central venous pressure.  Post procedure conclusions  Ascending Aorta:   - The aorta was normal, not dilated, and non-diseased.   Myoview 12/2009  No EKG changes with stress Normal perfusion, no ischemia Normal study  Recent Labs: No results found for requested labs within last 8760 hours.  Recent Lipid Panel No results found for: CHOL, TRIG, HDL, CHOLHDL, VLDL, LDLCALC, LDLDIRECT   Risk Assessment/Calculations:    CHA2DS2-VASc Score = 2  This indicates a 2.2% annual risk of stroke. The patient's score is based upon: CHF History: 0 HTN History: 1 Diabetes History: 0 Stroke History: 0 Vascular Disease History: 0 Age Score: 1 Gender Score: 0    Physical Exam:    VS:  BP (!) 154/98 (BP Location: Left Arm, Patient Position: Sitting, Cuff Size: Normal)    Pulse 85    Ht 5\' 11"   (1.803 m)    Wt 219 lb (99.3 kg)    BMI 30.54 kg/m     Wt Readings from Last 3 Encounters:  09/03/21 219 lb (99.3 kg)  04/06/20 213 lb (96.6 kg)  06/03/19 215 lb 3 oz (97.6 kg)     GEN:  Well nourished, well developed in no acute distress HEENT: Normal NECK: No JVD; No carotid bruits CARDIAC: Irregular RR, no murmurs, rubs, gallops RESPIRATORY:  Clear to auscultation without rales, wheezing or rhonchi  ABDOMEN: Soft, non-tender, non-distended MUSCULOSKELETAL:  No edema; No deformity. 2+ pedal pulses, equal bilaterally SKIN: Warm and dry NEUROLOGIC:  Alert and oriented x 3 PSYCHIATRIC:  Normal affect   EKG:  EKG is ordered today.  The ekg ordered today demonstrates atrial fibrillation at 85 bpm, no ST/T wave abnormality  Diagnoses:    1. Essential hypertension   2. PAF (paroxysmal atrial fibrillation) ([place])   3. Hyperlipidemia, unspecified hyperlipidemia type    Assessment and Plan:     PAF on chronic anticoagulation: In atrial fibrillation today, he is asymptomatic. He has not been seen in the office since 0539 so uncertain of how long he has been back in a fib. Has not been mentioned by other HCP.  Felt some fluttering a few weeks ago that was not bothersome. No change in activity tolerance, no dyspnea or chest pain. Denies bleeding problems on Eliquis and has not missed any doses.  We discussed likely causes of A-fib and he denies snoring, no recent stressful life situations, he does not drink alcohol. Discussed treatment options and through shared decision making he would like to continue with rate control strategy and anticoagulation at this time. Will get CBC, BMET today for maintenance of chronic anticoagulation and plan for follow-up in 6 months. Advised him to call back for sooner evaluation if symptoms worsen. Continue nebivolol, Eliquis.   Essential hypertension: BP is elevated today.  Home SBP 125-169 and DBP 80-107 mmHg. Home readings mostly elevated. He is compliant with  medications. Encouraged low sodium diet. We will increase HCTZ to 25 mg daily. Continue irbesartan, amlodipine, nebivolol, hydralazine, clonidine. Will recheck basic metabolic panel in 1 week.   Hyperlipidemia: LDL 61 02/2020. Will recheck today. Continue atorvastatin.    Disposition: 6 months with Dr. Debara [name] or APP   Medication Adjustments/Labs and Tests Ordered: Current medicines are reviewed at length with the patient today.  Concerns regarding medicines  are outlined above.  Orders Placed This Encounter  Procedures   Comprehensive metabolic panel   Lipid panel   TSH   CBC   Basic metabolic panel   EKG 00-XFGH   Meds ordered this encounter  Medications   irbesartan (AVAPRO) 300 MG tablet    Sig: Take 1 tablet (300 mg total) by mouth daily.    Dispense:  90 tablet    Refill:  3    Order Specific Question:   Supervising Provider    Answer:   Thayer Headings [[id number]]   Nebivolol HCl 20 MG TABS    Sig: Take 1 tablet (20 mg total) by mouth 2 (two) times daily.    Dispense:  90 tablet    Refill:  3   hydrochlorothiazide (HYDRODIURIL) 25 MG tablet    Sig: Take 1 tablet (25 mg total) by mouth daily.    Dispense:  90 tablet    Refill:  3   amLODipine (NORVASC) 10 MG tablet    Sig: Take 1 tablet (10 mg total) by mouth daily.    Dispense:  90 tablet    Refill:  3   atorvastatin (LIPITOR) 10 MG tablet    Sig: Take 1 tablet (10 mg total) by mouth daily at 6 PM.    Dispense:  90 tablet    Refill:  3   apixaban (ELIQUIS) 5 MG TABS tablet    Sig: TAKE 1 TABLET(5 MG) BY MOUTH TWICE DAILY    Dispense:  60 tablet    Refill:  3    Patient Instructions  Medication Instructions:  Discontinue Ibersartan/HCTZ. Star Ibersartan 300 mg ( 1 Tablet Daily). Start HCTZ 25 mg ( 1 Tablet Daily). *If you need a refill on your cardiac medications before your next appointment, please call your pharmacy*   Lab Work: CBC,CMET,Lipid, Tsh. : Today BMET. To Be Done in 1 week. If you have labs  (blood work) drawn today and your tests are completely normal, you will receive your results only by: [place] (if you have MyChart) OR A paper copy in the mail If you have any lab test that is abnormal or we need to change your treatment, we will call you to review the results.   Testing/Procedures: No Testing   Follow-Up: At [facility], you and your health needs are our priority.  As part of our continuing mission to provide you with exceptional heart care, we have created designated Provider Care Teams.  These Care Teams include your primary Cardiologist (physician) and Advanced Practice Providers (APPs -  Physician Assistants and Nurse Practitioners) who all work together to provide you with the care you need, when you need it.  We recommend signing up for the patient portal called "MyChart".  Sign up information is provided on this After Visit Summary.  MyChart is used to connect with patients for Virtual Visits (Telemedicine).  Patients are able to view lab/test results, encounter notes, upcoming appointments, etc.  Non-urgent messages can be sent to your provider as well.   To learn more about what you can do with MyChart, go to [email or web address].    Your next appointment:   6 month(s)  The format for your next appointment:   In Person  Provider:   Pixie Casino, MD         Signed, Emmaline Life, NP  09/03/2021 9:29 AM    [place]

## 2021-09-03 ENCOUNTER — Ambulatory Visit: Payer: PPO | Admitting: Nurse Practitioner

## 2021-09-03 ENCOUNTER — Other Ambulatory Visit: Payer: Self-pay

## 2021-09-03 ENCOUNTER — Encounter: Payer: Self-pay | Admitting: Adult Health

## 2021-09-03 VITALS — BP 154/98 | HR 85 | Ht 71.0 in | Wt 219.0 lb

## 2021-09-03 DIAGNOSIS — I10 Essential (primary) hypertension: Secondary | ICD-10-CM | POA: Diagnosis not present

## 2021-09-03 DIAGNOSIS — I480 Paroxysmal atrial fibrillation: Secondary | ICD-10-CM

## 2021-09-03 DIAGNOSIS — E785 Hyperlipidemia, unspecified: Secondary | ICD-10-CM

## 2021-09-03 LAB — LIPID PANEL
Chol/HDL Ratio: 2.7 ratio (ref 0.0–5.0)
Cholesterol, Total: 127 mg/dL (ref 100–199)
HDL: 47 mg/dL (ref >39)
LDL Chol Calc (NIH): 59 mg/dL (ref 0–99)
Triglycerides: 114 mg/dL (ref 0–149)
VLDL Cholesterol Cal: 21 mg/dL (ref 5–40)

## 2021-09-03 LAB — COMPREHENSIVE METABOLIC PANEL
ALT: 17 IU/L (ref 0–44)
AST: 15 IU/L (ref 0–40)
Albumin/Globulin Ratio: 1.8 (ref 1.2–2.2)
Albumin: 4.4 g/dL (ref 3.8–4.8)
Alkaline Phosphatase: 66 IU/L (ref 44–121)
BUN/Creatinine Ratio: 17 (ref 10–24)
BUN: 18 mg/dL (ref 8–27)
Bilirubin Total: 0.6 mg/dL (ref 0.0–1.2)
CO2: 26 mmol/L (ref 20–29)
Calcium: 9.1 mg/dL (ref 8.6–10.2)
Chloride: 105 mmol/L (ref 96–106)
Creatinine, Ser: 1.03 mg/dL (ref 0.76–1.27)
Globulin, Total: 2.4 g/dL (ref 1.5–4.5)
Glucose: 124 mg/dL — ABNORMAL HIGH (ref 70–99)
Potassium: 3.9 mmol/L (ref 3.5–5.2)
Sodium: 145 mmol/L — ABNORMAL HIGH (ref 134–144)
Total Protein: 6.8 g/dL (ref 6.0–8.5)
eGFR: 79 mL/min/{1.73_m2} (ref >59)

## 2021-09-03 LAB — CBC
Hematocrit: 50.6 % (ref 37.5–51.0)
Hemoglobin: 17 g/dL (ref 13.0–17.7)
MCH: 29.2 pg (ref 26.6–33.0)
MCHC: 33.6 g/dL (ref 31.5–35.7)
MCV: 87 fL (ref 79–97)
Platelets: 180 10*3/uL (ref 150–450)
RBC: 5.83 x10E6/uL — ABNORMAL HIGH (ref 4.14–5.80)
RDW: 14 % (ref 11.6–15.4)
WBC: 5.2 10*3/uL (ref 3.4–10.8)

## 2021-09-03 LAB — TSH: TSH: 2.31 u[IU]/mL (ref 0.450–4.500)

## 2021-09-03 MED ORDER — NEBIVOLOL HCL 20 MG PO TABS: 1.0000 | ORAL_TABLET | Freq: Two times a day (BID) | ORAL | 3 refills | Status: DC

## 2021-09-03 MED ORDER — APIXABAN 5 MG PO TABS: ORAL_TABLET | ORAL | 3 refills | Status: DC

## 2021-09-03 MED ORDER — AMLODIPINE BESYLATE 10 MG PO TABS: 10.0000 mg | ORAL_TABLET | Freq: Every day | ORAL | 3 refills | Status: DC

## 2021-09-03 MED ORDER — ATORVASTATIN CALCIUM 10 MG PO TABS: 10.0000 mg | ORAL_TABLET | Freq: Every day | ORAL | 3 refills | Status: DC

## 2021-09-03 MED ORDER — HYDROCHLOROTHIAZIDE 25 MG PO TABS: 25.0000 mg | ORAL_TABLET | Freq: Every day | ORAL | 3 refills | Status: DC

## 2021-09-03 MED ORDER — IRBESARTAN 300 MG PO TABS: 300.0000 mg | ORAL_TABLET | Freq: Every day | ORAL | 3 refills | Status: DC

## 2021-09-03 NOTE — Patient Instructions (Signed)
Medication Instructions:  Discontinue Ibersartan/HCTZ. Star Ibersartan 300 mg ( 1 Tablet Daily). Start HCTZ 25 mg ( 1 Tablet Daily). *If you need a refill on your cardiac medications before your next appointment, please call your pharmacy*   Lab Work: CBC,CMET,Lipid, Tsh. : Today BMET. To Be Done in 1 week. If you have labs (blood work) drawn today and your tests are completely normal, you will receive your results only by: [place] (if you have MyChart) OR A paper copy in the mail If you have any lab test that is abnormal or we need to change your treatment, we will call you to review the results.   Testing/Procedures: No Testing   Follow-Up: At [facility], you and your health needs are our priority.  As part of our continuing mission to provide you with exceptional heart care, we have created designated Provider Care Teams.  These Care Teams include your primary Cardiologist (physician) and Advanced Practice Providers (APPs -  Physician Assistants and Nurse Practitioners) who all work together to provide you with the care you need, when you need it.  We recommend signing up for the patient portal called "MyChart".  Sign up information is provided on this After Visit Summary.  MyChart is used to connect with patients for Virtual Visits (Telemedicine).  Patients are able to view lab/test results, encounter notes, upcoming appointments, etc.  Non-urgent messages can be sent to your provider as well.   To learn more about what you can do with MyChart, go to [email or web address].    Your next appointment:   6 month(s)  The format for your next appointment:   In Person  Provider:   Pixie Casino, MD

## 2021-09-13 DIAGNOSIS — I480 Paroxysmal atrial fibrillation: Secondary | ICD-10-CM | POA: Diagnosis not present

## 2021-09-13 DIAGNOSIS — I10 Essential (primary) hypertension: Secondary | ICD-10-CM | POA: Diagnosis not present

## 2021-09-13 LAB — BASIC METABOLIC PANEL
BUN/Creatinine Ratio: 26 — ABNORMAL HIGH (ref 10–24)
BUN: 23 mg/dL (ref 8–27)
CO2: 25 mmol/L (ref 20–29)
Calcium: 8.9 mg/dL (ref 8.6–10.2)
Chloride: 104 mmol/L (ref 96–106)
Creatinine, Ser: 0.89 mg/dL (ref 0.76–1.27)
Glucose: 115 mg/dL — ABNORMAL HIGH (ref 70–99)
Potassium: 3.4 mmol/L — ABNORMAL LOW (ref 3.5–5.2)
Sodium: 143 mmol/L (ref 134–144)
eGFR: 93 mL/min/{1.73_m2} (ref >59)

## 2021-09-14 ENCOUNTER — Other Ambulatory Visit: Payer: Self-pay

## 2021-09-14 DIAGNOSIS — E876 Hypokalemia: Secondary | ICD-10-CM

## 2021-09-14 MED ORDER — POTASSIUM CHLORIDE ER 10 MEQ PO TBCR: 10.0000 meq | EXTENDED_RELEASE_TABLET | Freq: Every day | ORAL | 3 refills | Status: DC

## 2021-09-18 ENCOUNTER — Other Ambulatory Visit: Payer: Self-pay | Admitting: Internal Medicine

## 2021-09-27 ENCOUNTER — Other Ambulatory Visit: Payer: Self-pay | Admitting: Internal Medicine

## 2021-10-18 DIAGNOSIS — E876 Hypokalemia: Secondary | ICD-10-CM | POA: Diagnosis not present

## 2021-10-18 LAB — BASIC METABOLIC PANEL
BUN/Creatinine Ratio: 20 (ref 10–24)
BUN: 17 mg/dL (ref 8–27)
CO2: 27 mmol/L (ref 20–29)
Calcium: 9 mg/dL (ref 8.6–10.2)
Chloride: 101 mmol/L (ref 96–106)
Creatinine, Ser: 0.86 mg/dL (ref 0.76–1.27)
Glucose: 121 mg/dL — ABNORMAL HIGH (ref 70–99)
Potassium: 3.4 mmol/L — ABNORMAL LOW (ref 3.5–5.2)
Sodium: 141 mmol/L (ref 134–144)
eGFR: 93 mL/min/{1.73_m2} (ref >59)

## 2021-10-19 ENCOUNTER — Other Ambulatory Visit: Payer: Self-pay | Admitting: *Deleted

## 2021-10-19 MED ORDER — POTASSIUM CHLORIDE ER 10 MEQ PO TBCR: 20.0000 meq | EXTENDED_RELEASE_TABLET | Freq: Every day | ORAL | 3 refills | Status: DC

## 2021-11-16 MED ORDER — POTASSIUM CHLORIDE ER 10 MEQ PO TBCR: 20.0000 meq | EXTENDED_RELEASE_TABLET | Freq: Every day | ORAL | 1 refills | Status: DC

## 2021-11-23 ENCOUNTER — Other Ambulatory Visit: Payer: Self-pay | Admitting: Internal Medicine

## 2021-12-20 ENCOUNTER — Other Ambulatory Visit: Payer: Self-pay | Admitting: Nurse Practitioner

## 2021-12-20 DIAGNOSIS — I4891 Unspecified atrial fibrillation: Secondary | ICD-10-CM

## 2021-12-20 NOTE — Telephone Encounter (Signed)
Prescription refill request for Eliquis received. Indication:Afib  Last office visit:09/03/21 ([place])  Scr: 0.86 (10/18/21) Age: 71 Weight: 99.3kg  Appropriate dose and refill sent to requested pharmacy.

## 2021-12-27 ENCOUNTER — Other Ambulatory Visit: Payer: Self-pay | Admitting: Internal Medicine

## 2022-01-04 DIAGNOSIS — E7800 Pure hypercholesterolemia, unspecified: Secondary | ICD-10-CM | POA: Diagnosis not present

## 2022-01-04 DIAGNOSIS — Z125 Encounter for screening for malignant neoplasm of prostate: Secondary | ICD-10-CM | POA: Diagnosis not present

## 2022-01-04 DIAGNOSIS — R7302 Impaired glucose tolerance (oral): Secondary | ICD-10-CM | POA: Diagnosis not present

## 2022-01-04 DIAGNOSIS — R7989 Other specified abnormal findings of blood chemistry: Secondary | ICD-10-CM | POA: Diagnosis not present

## 2022-01-06 DIAGNOSIS — R82998 Other abnormal findings in urine: Secondary | ICD-10-CM | POA: Diagnosis not present

## 2022-01-13 ENCOUNTER — Other Ambulatory Visit: Payer: Self-pay | Admitting: Internal Medicine

## 2022-01-21 DIAGNOSIS — E7800 Pure hypercholesterolemia, unspecified: Secondary | ICD-10-CM | POA: Diagnosis not present

## 2022-01-21 DIAGNOSIS — D692 Other nonthrombocytopenic purpura: Secondary | ICD-10-CM | POA: Diagnosis not present

## 2022-01-21 DIAGNOSIS — D6869 Other thrombophilia: Secondary | ICD-10-CM | POA: Diagnosis not present

## 2022-01-21 DIAGNOSIS — I129 Hypertensive chronic kidney disease with stage 1 through stage 4 chronic kidney disease, or unspecified chronic kidney disease: Secondary | ICD-10-CM | POA: Diagnosis not present

## 2022-01-21 DIAGNOSIS — I480 Paroxysmal atrial fibrillation: Secondary | ICD-10-CM | POA: Diagnosis not present

## 2022-01-21 DIAGNOSIS — N182 Chronic kidney disease, stage 2 (mild): Secondary | ICD-10-CM | POA: Diagnosis not present

## 2022-01-21 DIAGNOSIS — R7302 Impaired glucose tolerance (oral): Secondary | ICD-10-CM | POA: Diagnosis not present

## 2022-01-21 DIAGNOSIS — Z1331 Encounter for screening for depression: Secondary | ICD-10-CM | POA: Diagnosis not present

## 2022-01-21 DIAGNOSIS — Z7901 Long term (current) use of anticoagulants: Secondary | ICD-10-CM | POA: Diagnosis not present

## 2022-01-21 DIAGNOSIS — R809 Proteinuria, unspecified: Secondary | ICD-10-CM | POA: Diagnosis not present

## 2022-01-21 DIAGNOSIS — E669 Obesity, unspecified: Secondary | ICD-10-CM | POA: Diagnosis not present

## 2022-01-21 DIAGNOSIS — Z0000 Encounter for general adult medical examination without abnormal findings: Secondary | ICD-10-CM | POA: Diagnosis not present

## 2022-01-25 ENCOUNTER — Other Ambulatory Visit: Payer: Self-pay | Admitting: Internal Medicine

## 2022-01-26 ENCOUNTER — Other Ambulatory Visit: Payer: Self-pay | Admitting: Internal Medicine

## 2022-01-26 ENCOUNTER — Telehealth: Payer: Self-pay | Admitting: Internal Medicine

## 2022-01-26 NOTE — Telephone Encounter (Signed)
*  STAT* If patient is at the pharmacy, call can be transferred to refill team.   1. Which medications need to be refilled? (please list name of each medication and dose if known)  hydrALAZINE (APRESOLINE) 50 MG tablet  2. Which pharmacy/location (including street and city if local pharmacy) is medication to be sent to? [facility] [phone] - [place], [place]  3. Do they need a 30 day or 90 day supply? 90 with refills   Patient is out of medication

## 2022-01-27 ENCOUNTER — Telehealth: Payer: Self-pay

## 2022-01-27 ENCOUNTER — Other Ambulatory Visit: Payer: Self-pay | Admitting: Internal Medicine

## 2022-01-27 ENCOUNTER — Encounter: Payer: Self-pay | Admitting: Internal Medicine

## 2022-01-27 MED ORDER — HYDRALAZINE HCL 50 MG PO TABS: 50.0000 mg | ORAL_TABLET | Freq: Two times a day (BID) | ORAL | 0 refills | Status: DC

## 2022-01-27 NOTE — Telephone Encounter (Signed)
**Note De-Identified Malissa Slay Obfuscation** I called the pt to discuss options to lower his Elquis cost.  I advised him that since he is already in his donut hole for this year, a tier exception will not help his cost but that if a tier exception is done and approved at the beginning of the new year when his ins re-sets, it will.  We discussed BMSPAF and he is interested in applying as he is aware that if approved he will receive it free of charge from them for the remainder of this year. I provided him their phone number.  The pt is aware that if he wants to apply for pt asst through [facility] for Eliquis that he will need to contact Dr [facility] office for Eliquis samples and application question/concerns.  Also, I advised the pt (after s/w Marcelle Overlie, RPH) that there is a clinical trial currently going on that is studying a new Factor XI inhibitor vs Eliquis and if he qualifies he could be enrolled and would potentially get free medication for 3 years.  The pts states that he is interested in this as well.  He is aware that I am sending this message to Marcelle Overlie, [facility] so she can refer him to be screened and they will then call him and review the trial and see if he qualifies.

## 2022-01-27 NOTE — Telephone Encounter (Signed)
Referral for the [facility] AF trial sent to [facility] at medication management.

## 2022-02-21 DIAGNOSIS — L570 Actinic keratosis: Secondary | ICD-10-CM | POA: Diagnosis not present

## 2022-02-21 DIAGNOSIS — D485 Neoplasm of uncertain behavior of skin: Secondary | ICD-10-CM | POA: Diagnosis not present

## 2022-02-21 DIAGNOSIS — L821 Other seborrheic keratosis: Secondary | ICD-10-CM | POA: Diagnosis not present

## 2022-02-21 DIAGNOSIS — C44229 Squamous cell carcinoma of skin of left ear and external auricular canal: Secondary | ICD-10-CM | POA: Diagnosis not present

## 2022-03-03 DIAGNOSIS — C44229 Squamous cell carcinoma of skin of left ear and external auricular canal: Secondary | ICD-10-CM | POA: Diagnosis not present

## 2022-03-03 DIAGNOSIS — Z85828 Personal history of other malignant neoplasm of skin: Secondary | ICD-10-CM | POA: Diagnosis not present

## 2022-03-23 ENCOUNTER — Telehealth: Payer: Self-pay

## 2022-03-23 ENCOUNTER — Ambulatory Visit: Payer: PPO | Attending: Internal Medicine | Admitting: Internal Medicine

## 2022-03-23 ENCOUNTER — Encounter: Payer: Self-pay | Admitting: Internal Medicine

## 2022-03-23 VITALS — BP 135/80 | HR 80 | Ht 71.0 in | Wt 220.0 lb

## 2022-03-23 DIAGNOSIS — Z7901 Long term (current) use of anticoagulants: Secondary | ICD-10-CM

## 2022-03-23 DIAGNOSIS — I10 Essential (primary) hypertension: Secondary | ICD-10-CM | POA: Diagnosis not present

## 2022-03-23 DIAGNOSIS — E785 Hyperlipidemia, unspecified: Secondary | ICD-10-CM

## 2022-03-23 DIAGNOSIS — I4811 Longstanding persistent atrial fibrillation: Secondary | ICD-10-CM

## 2022-03-23 NOTE — Patient Instructions (Signed)
Medication Instructions:  Your physician recommends that you continue on your current medications as directed. Please refer to the Current Medication list given to you today.  *If you need a refill on your cardiac medications before your next appointment, please call your pharmacy*   Lab Work: Lipid panel before next appointment.  If you have labs (blood work) drawn today and your tests are completely normal, you will receive your results only by: [place] (if you have MyChart) OR A paper copy in the mail If you have any lab test that is abnormal or we need to change your treatment, we will call you to review the results.    Follow-Up: At [facility], you and your health needs are our priority.  As part of our continuing mission to provide you with exceptional heart care, we have created designated Provider Care Teams.  These Care Teams include your primary Cardiologist (physician) and Advanced Practice Providers (APPs -  Physician Assistants and Nurse Practitioners) who all work together to provide you with the care you need, when you need it.  We recommend signing up for the patient portal called "MyChart".  Sign up information is provided on this After Visit Summary.  MyChart is used to connect with patients for Virtual Visits (Telemedicine).  Patients are able to view lab/test results, encounter notes, upcoming appointments, etc.  Non-urgent messages can be sent to your provider as well.   To learn more about what you can do with MyChart, go to [email or web address].    Your next appointment:   12 month(s)  The format for your next appointment:   In Person  Provider:   Pixie Casino, MD

## 2022-03-23 NOTE — Progress Notes (Signed)
Virtual Visit via Video Note   This visit type was conducted due to national recommendations for restrictions regarding the COVID-19 Pandemic (e.g. social distancing) in an effort to limit this patient's exposure and mitigate transmission in our community.  Due to his co-morbid illnesses, this patient is at least at moderate risk for complications without adequate follow up.  This format is felt to be most appropriate for this patient at this time.  All issues noted in this document were discussed and addressed.  A limited physical exam was performed with this format.  Please refer to the patient's chart for his consent to telehealth for [facility].   Evaluation Performed:  Video ollow-up visit  Date:  03/23/2022   ID:  Trevor [name], DOB 1951-06-05, MRN [id number]  Patient Location:  [place] [address]  Provider location:   [address], [place], [place] [address]  PCP:  Haywood Pao, MD  Cardiologist:  Pixie Casino, MD Electrophysiologist:  None   Chief Complaint:  No complaints  History of Present Illness:    Trevor [name] is a 71 y.o. male who presents via audio/video conferencing for a telehealth visit today.  Trevor [name] was seen today for video follow-up.  He denies any chest pain or worsening shortness of breath.  He has no somatic A. fib.  He has no bleeding problems on Eliquis.  His blood pressure is been well controlled.  His abs were recently drawn for an upcoming visit with his primary care provider.  His total cholesterol is 119 and LDL was 55.  Hemoglobin A1c was 5.6.  The only abnormality was that his PSA was elevated at 4.77, up from 3+ according to our labs in 2018.  He does say that he had a history of what sounds like may have been prostatitis in the past.  He is not followed by a urologist currently.  04/06/2020  Trevor [name] seen today via telephone visit.  This is for an annual follow-up.  Overall he is doing well.  He  denies any symptomatic A. fib.  He has had no bleeding problems on Eliquis.  Blood pressure is decently controlled at 211 systolic on 4 different medications.  He has been struggling with prostate cancer but had radiation seed implant and his PSA is low if not undetectable.  Overall he feels well.  He exercises regularly.  He has no chest pain shortness of breath or exercise intolerance.  03/23/2022  Trevor [name] returns today for follow-up.  Overall he is doing well.  He was last seen earlier this year by Christen Bame, NP.  She increased his hydrochlorothiazide to 25 mg and separated it from his ARB.  Overall his blood pressure appears to be better.  He is also exercising more regularly at droppage.  Blood pressure now around 130/85.  He reports his PSA has been undetectable.  He had previously underwent radiation seed implants in 2021.  He saw his PCP back in July.  He was doing well.  Cholesterol is well controlled with LDL in the 50s.  He is in A-fib persistently.  He is asymptomatic with this.  He has been on Eliquis and had some concerns about cost and considered a clinical trial but ultimately stayed with the medication.  He denies any bleeding issues.  Prior CV studies:   The following studies were reviewed today:  Chart review  PMHx:  Past Medical History:  Diagnosis Date  Allergy    seasonal   Dyslipidemia    Dysrhythmia    ATRIAL FIB   Hyperlipidemia    preventitive due to brothers heart issues in his  32's   Hypertension    Prostate cancer [facility])     Past Surgical History:  Procedure Laterality Date   CARDIOVERSION N/A 02/01/2017   Procedure: CARDIOVERSION;  Surgeon: Pixie Casino, MD;  Location: [facility];  Service: Cardiovascular;  Laterality: N/A;   NM MYOCAR [place]   persantine myoview - normal perfusion, EF 64%, low risk scan   PROSTATE BIOPSY  04/04/2019   RADIOACTIVE SEED IMPLANT N/A 06/03/2019   Procedure: RADIOACTIVE SEED  IMPLANT/BRACHYTHERAPY IMPLANT WITH CYSTOSCOPY;  Surgeon: Raynelle Bring, MD;  Location: [place];  Service: Urology;  Laterality: N/A;   SPACE OAR INSTILLATION N/A 06/03/2019   Procedure: SPACE OAR INSTILLATION;  Surgeon: Raynelle Bring, MD;  Location: [facility];  Service: Urology;  Laterality: N/A;    FAMHx:  Family History  Problem Relation Age of Onset   Breast cancer Mother    Heart disease Father    Prostate cancer Father    Kidney disease Father    Heart disease Maternal Grandmother    Heart disease Maternal Grandfather    Heart attack Paternal Grandmother    Lupus Paternal Grandfather    Heart disease Brother        65's   Colon cancer Neg Hx     SOCHx:   reports that he has been smoking cigars. He has quit using smokeless tobacco. He reports that he does not drink alcohol and does not use drugs.  ALLERGIES:  No Known Allergies  MEDS:  Current Meds  Medication Sig   amLODipine (NORVASC) 10 MG tablet Take 1 tablet (10 mg total) by mouth daily.   apixaban (ELIQUIS) 5 MG TABS tablet TAKE 1 TABLET(5 MG) BY MOUTH TWICE DAILY   atorvastatin (LIPITOR) 10 MG tablet Take 1 tablet (10 mg total) by mouth daily at 6 PM.   cloNIDine (CATAPRES) 0.2 MG tablet TAKE 1 TABLET(0.2 MG) BY MOUTH TWICE DAILY   fexofenadine (ALLEGRA ALLERGY) 180 MG tablet Take 1 tablet po qd Oral   hydrALAZINE (APRESOLINE) 50 MG tablet TAKE 1 TABLET BY MOUTH TWICE DAILY(NEEDS OFFICE VISIT)   hydrochlorothiazide (HYDRODIURIL) 25 MG tablet Take 1 tablet (25 mg total) by mouth daily.   irbesartan (AVAPRO) 300 MG tablet Take 1 tablet (300 mg total) by mouth daily.   Nebivolol HCl 20 MG TABS Take 1 tablet (20 mg total) by mouth 2 (two) times daily.   potassium chloride (KLOR-CON) 10 MEQ tablet Take 2 tablets (20 mEq total) by mouth daily.   tamsulosin (FLOMAX) 0.4 MG CAPS capsule Take 0.4 mg by mouth daily after supper.      ROS: Pertinent items noted in HPI and remainder of  comprehensive ROS otherwise negative.  Labs/Other Tests and Data Reviewed:    Recent Labs: 09/03/2021: ALT 17; Hemoglobin 17.0; Platelets 180; TSH 2.310 10/18/2021: BUN 17; Creatinine, Ser 0.86; Potassium 3.4; Sodium 141   Recent Lipid Panel Lab Results  Component Value Date/Time   CHOL 127 09/03/2021 08:52 AM   TRIG 114 09/03/2021 08:52 AM   HDL 47 09/03/2021 08:52 AM   CHOLHDL 2.7 09/03/2021 08:52 AM   LDLCALC 59 09/03/2021 08:52 AM    Wt Readings from Last 3 Encounters:  03/23/22 220 lb (99.8 kg)  09/03/21 219 lb (99.3 kg)  04/06/20 213 lb (96.6  kg)     Exam:    Vital Signs:  BP 135/80   Pulse 80   Ht '5\' 11"'$  (1.803 m)   Wt 220 lb (99.8 kg)   SpO2 97%   BMI 30.68 kg/m    Deferred  ASSESSMENT & PLAN:    Longstanding persistent atrial fibrillation - CHADSVASC 2 on Eliquis Hypertension Dyslipidemia Elevated PSA-post radiation seed implant (2021)  Trevor [name] continues to do well but is in persistent atrial fibrillation.  He is asymptomatic with this on Eliquis with no bleeding issues.  Blood pressure is well controlled.  His cholesterol is at goal with LDL at 56.  His PSA has improved after radiation seed implants.  He had been followed by [name].  He denies any chest pain or shortness of breath.  Overall he is exercising and feels well.  No changes to his meds today.  Follow-up annually or sooner as necessary.  COVID-19 Education: The signs and symptoms of COVID-19 were discussed with the patient and how to seek care for testing (follow up with PCP or arrange E-visit).  The importance of social distancing was discussed today.  Patient Risk:   After full review of this patients clinical status, I feel that they are at least moderate risk at this time.  Time:   Today, I have spent 25 minutes with the patient with telehealth technology discussing tension, dyslipidemia, atrial fibrillation, elevated PSA.     Medication Adjustments/Labs and Tests Ordered: Current  medicines are reviewed at length with the patient today.  Concerns regarding medicines are outlined above.   Tests Ordered: No orders of the defined types were placed in this encounter.   Medication Changes: No orders of the defined types were placed in this encounter.   Disposition:  in 1 year(s)  Pixie Casino, MD, [facility] [place], [place] Director of the Advanced Lipid Disorders &  Cardiovascular Risk Reduction Clinic Diplomate of the American Board of Clinical Lipidology Attending Cardiologist  Direct Dial: [phone]  Fax: [phone]  Website:  [email or web address]  Pixie Casino, MD  03/23/2022 8:52 AM

## 2022-03-23 NOTE — Telephone Encounter (Signed)
I connected with  Trevor [name] on 03/23/22 by a video enabled telemedicine application and verified that I am speaking with the correct person using two identifiers.   I discussed the limitations of evaluation and management by telemedicine. The patient expressed understanding and agreed to proceed.

## 2022-04-09 ENCOUNTER — Other Ambulatory Visit: Payer: Self-pay | Admitting: Nurse Practitioner

## 2022-04-11 ENCOUNTER — Telehealth: Payer: Self-pay | Admitting: Internal Medicine

## 2022-04-11 MED ORDER — NEBIVOLOL HCL 20 MG PO TABS: 1.0000 | ORAL_TABLET | Freq: Two times a day (BID) | ORAL | 3 refills | Status: DC

## 2022-04-11 NOTE — Telephone Encounter (Signed)
*  STAT* If patient is at the pharmacy, call can be transferred to refill team.   1. Which medications need to be refilled? (please list name of each medication and dose if known) Nebivolol HCl 20 MG TABS  2. Which pharmacy/location (including street and city if local pharmacy) is medication to be sent to? [facility] [phone] - [place], [place] [address]  3. Do they need a 30 day or 90 day supply? [place]

## 2022-04-25 ENCOUNTER — Other Ambulatory Visit: Payer: Self-pay | Admitting: Internal Medicine

## 2022-05-09 ENCOUNTER — Other Ambulatory Visit: Payer: Self-pay | Admitting: Nurse Practitioner

## 2022-06-29 ENCOUNTER — Other Ambulatory Visit: Payer: Self-pay | Admitting: Internal Medicine

## 2022-06-29 DIAGNOSIS — I4891 Unspecified atrial fibrillation: Secondary | ICD-10-CM

## 2022-06-29 NOTE — Telephone Encounter (Signed)
Eliquis '5mg'$  refill request received. Patient is 71 years old, weight-99.8kg, Crea-0.86 on 10/18/2021, Diagnosis-Afib, and last seen by Dr. Debara Pickett on 03/23/2022. Dose is appropriate based on dosing criteria. Will send in refill to requested pharmacy.

## 2022-07-08 ENCOUNTER — Other Ambulatory Visit: Payer: Self-pay | Admitting: Internal Medicine

## 2022-07-12 ENCOUNTER — Other Ambulatory Visit: Payer: Self-pay | Admitting: Internal Medicine

## 2022-08-01 ENCOUNTER — Other Ambulatory Visit: Payer: Self-pay | Admitting: Internal Medicine

## 2022-08-11 ENCOUNTER — Other Ambulatory Visit: Payer: Self-pay | Admitting: Nurse Practitioner

## 2022-08-21 ENCOUNTER — Other Ambulatory Visit: Payer: Self-pay | Admitting: Nurse Practitioner

## 2022-08-23 ENCOUNTER — Telehealth: Payer: Self-pay | Admitting: Internal Medicine

## 2022-08-23 NOTE — Telephone Encounter (Signed)
*  STAT* If patient is at the pharmacy, call can be transferred to refill team.   1. Which medications need to be refilled? (please list name of each medication and dose if known) irbesartan (AVAPRO) 300 MG tablet    hydrochlorothiazide (HYDRODIURIL) 25 MG tablet (Expired)    2. Which pharmacy/location (including street and city if local pharmacy) is medication to be sent to? [place], [address]   3. Do they need a 30 day or 90 day supply? [place]

## 2022-08-24 ENCOUNTER — Other Ambulatory Visit: Payer: Self-pay | Admitting: Nurse Practitioner

## 2022-09-19 ENCOUNTER — Other Ambulatory Visit: Payer: Self-pay | Admitting: Nurse Practitioner

## 2022-09-19 ENCOUNTER — Telehealth: Payer: Self-pay | Admitting: Internal Medicine

## 2022-09-19 MED ORDER — HYDROCHLOROTHIAZIDE 25 MG PO TABS: 25.0000 mg | ORAL_TABLET | Freq: Every day | ORAL | 1 refills | Status: DC

## 2022-09-19 NOTE — Telephone Encounter (Signed)
90 day supply has been sent to the pharmacy. °

## 2022-09-19 NOTE — Telephone Encounter (Signed)
*  STAT* If patient is at the pharmacy, call can be transferred to refill team.   1. Which medications need to be refilled? (please list name of each medication and dose if known) irbesartan (AVAPRO) 300 MG tablet hydrochlorothiazide (HYDRODIURIL) 25 MG tablet   2. Which pharmacy/location (including street and city if local pharmacy) is medication to be sent to?  [place], [address]    3. Do they need a 30 day or 90 day supply? [place]

## 2022-10-10 ENCOUNTER — Other Ambulatory Visit: Payer: Self-pay | Admitting: Nurse Practitioner

## 2022-10-28 ENCOUNTER — Other Ambulatory Visit: Payer: Self-pay | Admitting: Internal Medicine

## 2022-10-28 DIAGNOSIS — I4891 Unspecified atrial fibrillation: Secondary | ICD-10-CM

## 2022-10-28 NOTE — Telephone Encounter (Signed)
Pt last saw Dr Rennis Golden 03/23/22, last labs 01/05/22 Creat 0.9 per care everywhere summary, age 72, weight 99.8kg, based on specified criteria pt is on appropriate dosage of Eliquis  BID for afib.  Will refill rx.

## 2023-01-01 ENCOUNTER — Other Ambulatory Visit: Payer: Self-pay | Admitting: Internal Medicine

## 2023-01-19 DIAGNOSIS — R7302 Impaired glucose tolerance (oral): Secondary | ICD-10-CM | POA: Diagnosis not present

## 2023-01-19 DIAGNOSIS — I129 Hypertensive chronic kidney disease with stage 1 through stage 4 chronic kidney disease, or unspecified chronic kidney disease: Secondary | ICD-10-CM | POA: Diagnosis not present

## 2023-01-19 DIAGNOSIS — C61 Malignant neoplasm of prostate: Secondary | ICD-10-CM | POA: Diagnosis not present

## 2023-01-19 DIAGNOSIS — E7800 Pure hypercholesterolemia, unspecified: Secondary | ICD-10-CM | POA: Diagnosis not present

## 2023-01-19 LAB — LAB REPORT - SCANNED
A1c: 6
EGFR: 83.2

## 2023-01-25 DIAGNOSIS — E669 Obesity, unspecified: Secondary | ICD-10-CM | POA: Diagnosis not present

## 2023-01-25 DIAGNOSIS — R82998 Other abnormal findings in urine: Secondary | ICD-10-CM | POA: Diagnosis not present

## 2023-01-25 DIAGNOSIS — I129 Hypertensive chronic kidney disease with stage 1 through stage 4 chronic kidney disease, or unspecified chronic kidney disease: Secondary | ICD-10-CM | POA: Diagnosis not present

## 2023-01-25 DIAGNOSIS — E7800 Pure hypercholesterolemia, unspecified: Secondary | ICD-10-CM | POA: Diagnosis not present

## 2023-01-25 DIAGNOSIS — C61 Malignant neoplasm of prostate: Secondary | ICD-10-CM | POA: Diagnosis not present

## 2023-01-25 DIAGNOSIS — Z7901 Long term (current) use of anticoagulants: Secondary | ICD-10-CM | POA: Diagnosis not present

## 2023-01-25 DIAGNOSIS — Z23 Encounter for immunization: Secondary | ICD-10-CM | POA: Diagnosis not present

## 2023-01-25 DIAGNOSIS — D6869 Other thrombophilia: Secondary | ICD-10-CM | POA: Diagnosis not present

## 2023-01-25 DIAGNOSIS — D692 Other nonthrombocytopenic purpura: Secondary | ICD-10-CM | POA: Diagnosis not present

## 2023-01-25 DIAGNOSIS — Z1339 Encounter for screening examination for other mental health and behavioral disorders: Secondary | ICD-10-CM | POA: Diagnosis not present

## 2023-01-25 DIAGNOSIS — Z1331 Encounter for screening for depression: Secondary | ICD-10-CM | POA: Diagnosis not present

## 2023-01-25 DIAGNOSIS — R7302 Impaired glucose tolerance (oral): Secondary | ICD-10-CM | POA: Diagnosis not present

## 2023-01-25 DIAGNOSIS — N182 Chronic kidney disease, stage 2 (mild): Secondary | ICD-10-CM | POA: Diagnosis not present

## 2023-01-25 DIAGNOSIS — I480 Paroxysmal atrial fibrillation: Secondary | ICD-10-CM | POA: Diagnosis not present

## 2023-01-25 DIAGNOSIS — Z6831 Body mass index (BMI) 31.0-31.9, adult: Secondary | ICD-10-CM | POA: Diagnosis not present

## 2023-01-25 DIAGNOSIS — Z0000 Encounter for general adult medical examination without abnormal findings: Secondary | ICD-10-CM | POA: Diagnosis not present

## 2023-02-13 ENCOUNTER — Other Ambulatory Visit: Payer: Self-pay | Admitting: Internal Medicine

## 2023-02-22 DIAGNOSIS — D0461 Carcinoma in situ of skin of right upper limb, including shoulder: Secondary | ICD-10-CM | POA: Diagnosis not present

## 2023-02-22 DIAGNOSIS — L905 Scar conditions and fibrosis of skin: Secondary | ICD-10-CM | POA: Diagnosis not present

## 2023-02-22 DIAGNOSIS — L821 Other seborrheic keratosis: Secondary | ICD-10-CM | POA: Diagnosis not present

## 2023-02-22 DIAGNOSIS — Z85828 Personal history of other malignant neoplasm of skin: Secondary | ICD-10-CM | POA: Diagnosis not present

## 2023-02-22 DIAGNOSIS — L814 Other melanin hyperpigmentation: Secondary | ICD-10-CM | POA: Diagnosis not present

## 2023-02-22 DIAGNOSIS — L570 Actinic keratosis: Secondary | ICD-10-CM | POA: Diagnosis not present

## 2023-02-22 DIAGNOSIS — D485 Neoplasm of uncertain behavior of skin: Secondary | ICD-10-CM | POA: Diagnosis not present

## 2023-03-24 ENCOUNTER — Encounter: Payer: Self-pay | Admitting: Internal Medicine

## 2023-03-24 ENCOUNTER — Ambulatory Visit: Payer: PPO | Attending: Internal Medicine | Admitting: Internal Medicine

## 2023-03-24 VITALS — BP 162/104 | HR 73 | Ht 71.0 in | Wt 225.0 lb

## 2023-03-24 DIAGNOSIS — I4510 Unspecified right bundle-branch block: Secondary | ICD-10-CM | POA: Diagnosis not present

## 2023-03-24 DIAGNOSIS — Z7901 Long term (current) use of anticoagulants: Secondary | ICD-10-CM | POA: Diagnosis not present

## 2023-03-24 DIAGNOSIS — I4811 Longstanding persistent atrial fibrillation: Secondary | ICD-10-CM | POA: Diagnosis not present

## 2023-03-24 DIAGNOSIS — I10 Essential (primary) hypertension: Secondary | ICD-10-CM

## 2023-03-24 DIAGNOSIS — E669 Obesity, unspecified: Secondary | ICD-10-CM | POA: Diagnosis not present

## 2023-03-24 NOTE — Progress Notes (Signed)
OFFICE NOTE  Chief Complaint:  Routine follow-up  Primary Care Physician: Tisovec, Adelfa Koh, MD  HPI:  Trevor [name]  is a pleasant 72 year old gentleman with history of resistant hypertension, on numerous medications including clonidine, Tribenzor, hydralazine, and Bystolic. Blood pressures at home seem to be well controlled; however, he does have additional white coat syndrome. Today in the office, his blood pressure was 180/110 and did not come down after another recheck toward the end of the visit. Blood pressures at home, per a diary that he has kept, range between the 120s and 130s up to 160s, with an average blood pressure between 135 and 140 systolic and 80 to 90 diastolic. Overall, I think this is pretty good control. He is asymptomatic. He denies any chest pain, worsening shortness of breath, palpitations, presyncope, or syncopal symptoms. He tells me that he had a recent physical with you and had a lipid profile checked, which was within normal limits. Recent laboratory work was received from your office which demonstrates total cholesterol 128, triglycerides 158, HDL 40 and LDL 56.  Hemoglobin A1c was 5.6.  Some Trevor [name] back in the office today. He is currently without any complaints. He recently had a urinary tract infection and is on anabolic for this. He denies any chest pain or worsening shortness of breath. Blood pressure has been well controlled. He's had no side effects from his cholesterol medicines.  01/07/2016  Trevor [name] presents today for annual follow-up. Overall he seems to be doing quite well. He had a recent office visit with his primary care provider and a clean bill of health. Blood pressure has been reasonably well controlled given the number of medicines he is on. His blood pressures at home range from the mid 130s to 140s although in the office today it was elevated to 166/106. I rechecked the blood pressure at the end of the visit and was 150/80. He denies any  chest pain or shortness of breath.  01/02/2017  I saw Trevor [name] today in follow-up. He reports continuing to feel well. He denies any chest pain or worsening shortness of breath. Occasionally notices heart rate is elevated. He's had no worsening fatigue and continues to work outside in [employer] job. Blood pressure is elevated 174/107 however came down on recheck. His blood pressures at home were reviewed personally indicating good control between the 120s and 140 systolic and diastolics in the 70s. He is on a number of medications for this. A routine EKG today was performed which indicated an irregularly irregular rhythm that was clearly atrial fibrillation. He has no history of atrial fibrillation and this is a new diagnosis. He denies any chest pain.   03/06/2017  Trevor [name] returns today for follow-up. He underwent successful cardioversion by myself on 02/01/2017. Since then he's maintained sinus rhythm. He reports some improvement in his fatigue. Blood pressure remains elevated today and was 210/112. He reports compliance with his medication. He is now on 5 different blood pressure medications including clonidine twice daily. He denies any end organ side effects such as chest pain, shortness of breath, visual changes or decreased urination.  12/07/2017  Trevor [name] was seen today in follow-up.  Over the last year he is done very well.  He seems to be maintaining sinus rhythm.  He denies chest pain or worsening shortness of breath.  He has no significant fatigue.  Blood pressure initially was 178/90, however I suspect he has significant whitecoat hypertension.  I rechecked it  came down to 160/80.  I reviewed the list of home blood pressure readings, which were generally in the 120-130s systolic with some occasional 952 and 150 readings.  He denies any bleeding issues on Eliquis.  Recent labs demonstrated total cholesterol 118, HDL 39, LDL 57 and triglycerides 110.  03/24/2023  Trevor [name] returns  today for follow-up.  His blood pressure was significantly elevated today 162/104.  He reports having taken his medicines right before he came to the office.  He did bring home blood pressure readings which show much better control.  I repeated the blood pressure and it still about 180/100.  Not clear why his blood pressure is elevated today denies any chest pain, blurred vision headache or other associated symptoms.  He does have a new right bundle branch block and EKG today which does show A-fib.  His last echo was in 2018.  He did have recent blood work from his primary care provider in July showing A1c 6.0%, total cholesterol 130, triglycerides 124, HDL 38 and LDL 67.  Creatinine was normal at 0.9, liver enzymes were normal and CBC was normal as well.  PMHx:  Past Medical History:  Diagnosis Date   Allergy    seasonal   Dyslipidemia    Dysrhythmia    ATRIAL FIB   Hyperlipidemia    preventitive due to brothers heart issues in his  32's   Hypertension    Prostate cancer [facility])     Past Surgical History:  Procedure Laterality Date   CARDIOVERSION N/A 02/01/2017   Procedure: CARDIOVERSION;  Surgeon: Chrystie Nose, MD;  Location: [facility];  Service: Cardiovascular;  Laterality: N/A;   NM MYOCAR PERF WALL MOTION  2011   persantine myoview - normal perfusion, EF 64%, low risk scan   PROSTATE BIOPSY  04/04/2019   RADIOACTIVE SEED IMPLANT N/A 06/03/2019   Procedure: RADIOACTIVE SEED IMPLANT/BRACHYTHERAPY IMPLANT WITH CYSTOSCOPY;  Surgeon: Heloise Purpura, MD;  Location: [facility];  Service: Urology;  Laterality: N/A;   SPACE OAR INSTILLATION N/A 06/03/2019   Procedure: SPACE OAR INSTILLATION;  Surgeon: Heloise Purpura, MD;  Location: [facility];  Service: Urology;  Laterality: N/A;    FAMHx:  Family History  Problem Relation Age of Onset   Breast cancer Mother    Heart disease Father    Prostate cancer Father    Kidney disease Father    Heart disease  Maternal Grandmother    Heart disease Maternal Grandfather    Heart attack Paternal Grandmother    Lupus Paternal Grandfather    Heart disease Brother        63's   Colon cancer Neg Hx     SOCHx:   reports that he has quit smoking. His smoking use included cigars. He has quit using smokeless tobacco. He reports that he does not drink alcohol and does not use drugs.  ALLERGIES:  No Known Allergies  ROS: Pertinent items noted in HPI and remainder of comprehensive ROS otherwise negative.  HOME MEDS: Current Outpatient Medications  Medication Sig Dispense Refill   amLODipine (NORVASC) 10 MG tablet TAKE 1 TABLET BY MOUTH DAILY 90 tablet 3   apixaban (ELIQUIS) 5 MG TABS tablet TAKE 1 TABLET(5 MG) BY MOUTH TWICE DAILY 180 tablet 1   atorvastatin (LIPITOR) 10 MG tablet TAKE 1 TABLET(10 MG) BY MOUTH DAILY AT 6 PM 90 tablet 3   cloNIDine (CATAPRES) 0.2 MG tablet TAKE 1 TABLET(0.2 MG) BY MOUTH TWICE DAILY 180 tablet 2  fexofenadine (ALLEGRA ALLERGY) 180 MG tablet Take 1 tablet po qd Oral     hydrALAZINE (APRESOLINE) 50 MG tablet TAKE 1 TABLET BY MOUTH TWICE DAILY 180 tablet 3   hydrochlorothiazide (HYDRODIURIL) 25 MG tablet Take 1 tablet (25 mg total) by mouth daily. Please keep upcoming appointment in September for future refills. Thank you. 90 tablet 1   irbesartan (AVAPRO) 300 MG tablet Take 1 tablet (300 mg total) by mouth daily. Please keep upcoming appointment in September for future refills. Thank you. 30 tablet 6   Nebivolol HCl 20 MG TABS Take 1 tablet (20 mg total) by mouth in the morning and at bedtime. 2 tablet 0   potassium chloride (KLOR-CON) 10 MEQ tablet TAKE 2 TABLETS(20 MEQ) BY MOUTH DAILY 180 tablet 3   tamsulosin (FLOMAX) 0.4 MG CAPS capsule Take 0.4 mg by mouth daily after supper.      No current facility-administered medications for this visit.    LABS/IMAGING: No results found for this or any previous visit (from the past 48 hour(s)). No results  found.  VITALS: BP (!) 162/104   Pulse 73   Ht 5\' 11"  (1.803 m)   Wt 225 lb (102.1 kg)   SpO2 96%   BMI 31.38 kg/m   EXAM: General appearance: alert and no distress Neck: no carotid bruit, no JVD and thyroid not enlarged, symmetric, no tenderness/mass/nodules Lungs: clear to auscultation bilaterally Heart: regular rate and rhythm, S1, S2 normal, no murmur, click, rub or gallop Abdomen: soft, non-tender; bowel sounds normal; no masses,  no organomegaly Extremities: extremities normal, atraumatic, no cyanosis or edema Pulses: 2+ and symmetric Skin: Skin color, texture, turgor normal. No rashes or lesions Neurologic: Grossly normal Psych: Pleasant  EKG: EKG Interpretation Date/Time:  Friday March 24 2023 08:06:37 EDT Ventricular Rate:  73 PR Interval:  144 QRS Duration:  84 QT Interval:  412 QTC Calculation: 453 R Axis:   -17  Text Interpretation: Atrial fibrillation RBBB Inferior infarct (cited on or before 01-Feb-2017) Compared to previous tracing a RBBB is now present Confirmed by Zoila Shutter [phone]) on 03/24/2023 8:17:33 AM    ASSESSMENT: Longstanding persistent atrial fibrillation - CHADSVASC 2 RBBB Hypertension Dyslipidemia Obesity Prediabetes-A1c 6.0%  PLAN: 1.   Trevor [name] has significant hypertension today.  The etiology is unclear as it seems like he has been well-controlled at home.  He had recent lab work which seems to be well treated.  Home blood pressure readings are much better.  He is asymptomatic today.  He is in A-fib.  There is a new right bundle branch block.  I like to get a repeat echo since he has not had one in about 6 years to further understand this.  He should follow blood pressure readings at home.  I told him he can take an extra clonidine today to try to help bring that blood pressure down and he should relax and rest at home.  If the blood pressure remains elevated we may need to increase his medications.  He was also concerned about  inability to lose weight.  Recently in fact he seems to be gaining some weight and he is exercising regularly and has made significant changes in his diet.  He is interested in the GLP-1 agonists.  I will refer him to our pharmacist to further this discussion as he may likely qualify.  Follow-up with me in 6 months or sooner as necessary.  Chrystie Nose, MD, [facility], FACP    [facility]  Medical Director of the Advanced Lipid Disorders &  Cardiovascular Risk Reduction Clinic Diplomate of the American Board of Clinical Lipidology Attending Cardiologist  Direct Dial: [phone]  Fax: [phone]  Website:  [email or web address] Nicely Trevor [name] 03/24/2023, 8:18 AM

## 2023-03-24 NOTE — Addendum Note (Signed)
Addended by: Lindell Spar on: 03/24/2023 08:58 AM   Modules accepted: Orders

## 2023-03-24 NOTE — Patient Instructions (Addendum)
Medication Instructions:  OK to take extra clonidine x1 dose today   *If you need a refill on your cardiac medications before your next appointment, please call your pharmacy*  Testing/Procedures: Your physician has requested that you have an echocardiogram. Echocardiography is a painless test that uses sound waves to create images of your heart. It provides your doctor with information about the size and shape of your heart and how well your heart's chambers and valves are working. This procedure takes approximately one hour. There are no restrictions for this procedure. Please do NOT wear cologne, perfume, aftershave, or lotions (deodorant is allowed). Please arrive 15 minutes prior to your appointment time.   Follow-Up: At [facility], you and your health needs are our priority.  As part of our continuing mission to provide you with exceptional heart care, we have created designated Provider Care Teams.  These Care Teams include your primary Cardiologist (physician) and Advanced Practice Providers (APPs -  Physician Assistants and Nurse Practitioners) who all work together to provide you with the care you need, when you need it.  We recommend signing up for the patient portal called "MyChart".  Sign up information is provided on this After Visit Summary.  MyChart is used to connect with patients for Virtual Visits (Telemedicine).  Patients are able to view lab/test results, encounter notes, upcoming appointments, etc.  Non-urgent messages can be sent to your provider as well.   To learn more about what you can do with MyChart, go to [email or web address].    Your next appointment:   6 months with Dr. Georgeann Oppenheim have been referred to our clinical pharmacy team for discussion of weight management

## 2023-04-05 ENCOUNTER — Encounter: Payer: Self-pay | Admitting: Pharmacist Clinician (PhC)/ Clinical Pharmacy Specialist

## 2023-04-05 ENCOUNTER — Other Ambulatory Visit (HOSPITAL_BASED_OUTPATIENT_CLINIC_OR_DEPARTMENT_OTHER): Payer: Self-pay

## 2023-04-05 ENCOUNTER — Ambulatory Visit (HOSPITAL_BASED_OUTPATIENT_CLINIC_OR_DEPARTMENT_OTHER): Payer: PPO

## 2023-04-05 ENCOUNTER — Ambulatory Visit: Payer: PPO | Attending: Cardiology | Admitting: Pharmacist Clinician (PhC)/ Clinical Pharmacy Specialist

## 2023-04-05 ENCOUNTER — Encounter: Payer: Self-pay | Admitting: Internal Medicine

## 2023-04-05 VITALS — Ht 71.0 in | Wt 220.0 lb

## 2023-04-05 DIAGNOSIS — E669 Obesity, unspecified: Secondary | ICD-10-CM | POA: Insufficient documentation

## 2023-04-05 DIAGNOSIS — I4510 Unspecified right bundle-branch block: Secondary | ICD-10-CM | POA: Insufficient documentation

## 2023-04-05 DIAGNOSIS — I4811 Longstanding persistent atrial fibrillation: Secondary | ICD-10-CM | POA: Insufficient documentation

## 2023-04-05 LAB — ECHOCARDIOGRAM COMPLETE
Area-P 1/2: 3.76 cm2
Height: 71 in
S' Lateral: 2.9 cm
Weight: 3520 oz

## 2023-04-05 MED ORDER — TIRZEPATIDE-WEIGHT MANAGEMENT 2.5 MG/0.5ML ~~LOC~~ SOLN: 2.5000 mg | SUBCUTANEOUS | 1 refills | Status: DC

## 2023-04-05 MED ORDER — TIRZEPATIDE-WEIGHT MANAGEMENT 2.5 MG/0.5ML ~~LOC~~ SOLN
2.5000 mg | SUBCUTANEOUS | 1 refills | Status: DC
  Filled 2023-04-05: qty 2, 28d supply, fill #0

## 2023-04-05 MED ORDER — TIRZEPATIDE-WEIGHT MANAGEMENT 5 MG/0.5ML ~~LOC~~ SOLN
5.0000 mg | SUBCUTANEOUS | 1 refills | Status: DC
  Filled 2023-04-05: qty 2, 28d supply, fill #0

## 2023-04-05 MED ORDER — TIRZEPATIDE-WEIGHT MANAGEMENT 5 MG/0.5ML ~~LOC~~ SOLN: 5.0000 mg | SUBCUTANEOUS | 1 refills | Status: DC

## 2023-04-05 NOTE — Assessment & Plan Note (Addendum)
  Patient has not met goal of at least 5% of body weight loss with comprehensive lifestyle modifications alone in the past 3-6 months. Pharmacotherapy is appropriate to pursue as augmentation. Will start Zepbound for out of pocket paying patients.   Confirmed patient has no personal or family history of medullary thyroid carcinoma (MTC) or Multiple Endocrine Neoplasia syndrome type 2 (MEN 2).   Advised patient on common side effects including nausea, diarrhea, dyspepsia, decreased appetite, and fatigue. Counseled patient on reducing meal size and how to titrate medication to minimize side effects. Patient aware to call if intolerable side effects or if experiencing dehydration, abdominal pain, or dizziness. Patient will adhere to dietary modifications and will target at least 150 minutes of moderate intensity exercise weekly.   Injection technique reviewed at today's visit.    Titration Plan:  Will plan to follow the titration plan as below, pending patient is tolerating each dose before increasing to the next. Can slow titration if needed for tolerability.    -Month 1: Inject 2.5 mg SQ once weekly x 4 weeks -Month 2 + : Inject 5 mg SQ once weekly  Follow up in 3 months.

## 2023-04-05 NOTE — Progress Notes (Signed)
Office Visit    Patient Name: Trevor [name] Date of Encounter: 04/05/2023  Primary Care Provider:  Gaspar Garbe, MD Primary Cardiologist:  Chrystie Nose, MD  Chief Complaint    Weight management  Significant Past Medical History                    No Known Allergies  History of Present Illness    Trevor [name] is a 72 y.o. male patient of Dr Rennis Golden, in the office today to discuss options for weight management.  Patient notes that he has not been able to lose any weight despite watching his diet and joining a gym.  He understands that Zepbound and Reginal Lutes are not available on his insurance plan, and he is willing to pay out of pocket for a few months to see if he can get any benefit.    Current weight management medications: none  Previously tried meds: none  Current meds that may affect weight: none  Baseline weight/BMI: 220 lb // 30.7  Insurance payor:  [employer]  Diet: mostly home cooked meals, only eats out 1-2 times per week, no fast foods  Exercise: joined [employer] 3 days per week  1 mile on track plus weight program  Confirmed patient with no personal or family history of medullary thyroid carcinoma (MTC) or Multiple Endocrine Neoplasia syndrome type 2 (MEN 2).   Social History:   Tobacco: no  Alcohol: no  Caffeine: coffee and diet sodas  Accessory Clinical Findings    Lab Results  Component Value Date   CREATININE 0.86 10/18/2021   BUN 17 10/18/2021   NA 141 10/18/2021   K 3.4 (L) 10/18/2021   CL 101 10/18/2021   CO2 27 10/18/2021   Lab Results  Component Value Date   ALT 17 09/03/2021   AST 15 09/03/2021   ALKPHOS 66 09/03/2021   BILITOT 0.6 09/03/2021   No results found for: "HGBA1C"    Home Medications/Allergies    Current Outpatient Medications  Medication Sig Dispense Refill   amLODipine (NORVASC) 10 MG tablet TAKE 1 TABLET BY MOUTH DAILY 90 tablet 3   apixaban (ELIQUIS) 5 MG TABS tablet TAKE 1  TABLET(5 MG) BY MOUTH TWICE DAILY 180 tablet 1   atorvastatin (LIPITOR) 10 MG tablet TAKE 1 TABLET(10 MG) BY MOUTH DAILY AT 6 PM 90 tablet 3   cloNIDine (CATAPRES) 0.2 MG tablet TAKE 1 TABLET(0.2 MG) BY MOUTH TWICE DAILY 180 tablet 2   fexofenadine (ALLEGRA ALLERGY) 180 MG tablet Take 1 tablet po qd Oral     hydrALAZINE (APRESOLINE) 50 MG tablet TAKE 1 TABLET BY MOUTH TWICE DAILY 180 tablet 3   hydrochlorothiazide (HYDRODIURIL) 25 MG tablet Take 1 tablet (25 mg total) by mouth daily. Please keep upcoming appointment in September for future refills. Thank you. 90 tablet 1   irbesartan (AVAPRO) 300 MG tablet Take 1 tablet (300 mg total) by mouth daily. Please keep upcoming appointment in September for future refills. Thank you. 30 tablet 6   Nebivolol HCl 20 MG TABS Take 1 tablet (20 mg total) by mouth in the morning and at bedtime. 2 tablet 0   potassium chloride (KLOR-CON) 10 MEQ tablet TAKE 2 TABLETS(20 MEQ) BY MOUTH DAILY 180 tablet 3   tamsulosin (FLOMAX) 0.4 MG CAPS capsule Take 0.4 mg by mouth daily after supper.      tirzepatide (ZEPBOUND) 2.5 MG/0.5ML injection vial Inject 2.5 mg into the skin every  7 (seven) days. 2 mL 1   tirzepatide 5 MG/0.5ML injection vial Inject 5 mg into the skin every 7 (seven) days. 2 mL 1   No current facility-administered medications for this visit.     No Known Allergies  Assessment & Plan    Obesity (BMI 30-39.9)  Patient has not met goal of at least 5% of body weight loss with comprehensive lifestyle modifications alone in the past 3-6 months. Pharmacotherapy is appropriate to pursue as augmentation. Will start Zepbound for out of pocket paying patients.   Confirmed patient has no personal or family history of medullary thyroid carcinoma (MTC) or Multiple Endocrine Neoplasia syndrome type 2 (MEN 2).   Advised patient on common side effects including nausea, diarrhea, dyspepsia, decreased appetite, and fatigue. Counseled patient on reducing meal size and  how to titrate medication to minimize side effects. Patient aware to call if intolerable side effects or if experiencing dehydration, abdominal pain, or dizziness. Patient will adhere to dietary modifications and will target at least 150 minutes of moderate intensity exercise weekly.   Injection technique reviewed at today's visit.    Titration Plan:  Will plan to follow the titration plan as below, pending patient is tolerating each dose before increasing to the next. Can slow titration if needed for tolerability.    -Month 1: Inject 2.5 mg SQ once weekly x 4 weeks -Month 2 + : Inject 5 mg SQ once weekly  Follow up in 3 months.    Phillips Hay PharmD CPP [facility]  [address] [place], [place] [address] [phone]

## 2023-04-05 NOTE — Telephone Encounter (Signed)
Erroneous encounter

## 2023-04-05 NOTE — Patient Instructions (Addendum)
Go to [email or web address] and click on the line that says "insurance through Medicare".  From there click on "explore cash options"  TIPS FOR SUCCESS Write down the reasons why you want to lose weight and post it in a place where you'll see it often. Start small and work your way up. Keep in mind that it takes time to achieve goals, and small steps add up. Any additional movements help to burn calories. Taking the stairs rather than the elevator and parking at the far end of your parking lot are easy ways to start. Brisk walking for at least 30 minutes 4 or more days of the week is an excellent goal to work toward  [employer] WHAT IT MEANS TO FEEL FULL Did you know that it can take 15 minutes or more for your brain to receive the message that you've eaten? That means that, if you eat less food, but consume it slower, you may still feel satisfied. Eating a lot of fruits and vegetables can also help you feel fuller. Eat off of smaller plates so that moderate portions don't seem too small  TITRATION PLAN Will plan to follow the titration plan as below, pending patient is tolerating each dose before increasing to the next. Can slow titration if needed for tolerability.    -Weeks 1-4: Inject 2.5 mg SQ once weekly (can stay at this dose if you feel it works well) -Weeks 5+: Inject 5 mg SQ once weekly   Follow up in 3 months.  If you have any questions or concerns, please reach out to [place].  Harmani Neto at [phone].  THANK YOU FOR CHOOSING [facility]

## 2023-04-08 ENCOUNTER — Other Ambulatory Visit: Payer: Self-pay | Admitting: Internal Medicine

## 2023-04-10 ENCOUNTER — Other Ambulatory Visit (HOSPITAL_COMMUNITY): Payer: PPO

## 2023-04-21 ENCOUNTER — Other Ambulatory Visit: Payer: Self-pay | Admitting: Nurse Practitioner

## 2023-04-22 ENCOUNTER — Other Ambulatory Visit: Payer: Self-pay | Admitting: Internal Medicine

## 2023-04-22 DIAGNOSIS — I4891 Unspecified atrial fibrillation: Secondary | ICD-10-CM

## 2023-04-24 ENCOUNTER — Telehealth: Payer: Self-pay | Admitting: Internal Medicine

## 2023-04-24 NOTE — Telephone Encounter (Signed)
Prescription refill request for Eliquis received. Indication:afib Last office visit:9/24 Scr:0.9  7/24 Age: 72 Weight:99.8  kg  Prescription refilled

## 2023-04-24 NOTE — Telephone Encounter (Signed)
*  STAT* If patient is at the pharmacy, call can be transferred to refill team.   1. Which medications need to be refilled? (please list name of each medication and dose if known)   tirzepatide (ZEPBOUND) 2.5 MG/0.5ML injection vial      4. Which pharmacy/location (including street and city if local pharmacy) is medication to be sent to? [facility] - [place], [place] - [address]     5. Do they need a 30 day or 90 day supply? 30

## 2023-04-25 ENCOUNTER — Other Ambulatory Visit: Payer: Self-pay

## 2023-04-25 MED ORDER — TIRZEPATIDE-WEIGHT MANAGEMENT 2.5 MG/0.5ML ~~LOC~~ SOLN: 2.5000 mg | SUBCUTANEOUS | 1 refills | Status: DC

## 2023-04-26 MED ORDER — TIRZEPATIDE-WEIGHT MANAGEMENT 2.5 MG/0.5ML ~~LOC~~ SOLN: 2.5000 mg | SUBCUTANEOUS | 0 refills | Status: DC

## 2023-04-26 NOTE — Telephone Encounter (Signed)
Refill already sent in yesterday. I'll resend again, they require pt's phone # as well in pharmacy comments.

## 2023-05-20 ENCOUNTER — Other Ambulatory Visit: Payer: Self-pay | Admitting: Internal Medicine

## 2023-05-21 ENCOUNTER — Encounter: Payer: Self-pay | Admitting: Pharmacist Clinician (PhC)/ Clinical Pharmacy Specialist

## 2023-06-19 ENCOUNTER — Ambulatory Visit: Payer: PPO | Admitting: Orthopedic Surgery

## 2023-07-03 ENCOUNTER — Other Ambulatory Visit: Payer: Self-pay | Admitting: Internal Medicine

## 2023-07-03 DIAGNOSIS — E669 Obesity, unspecified: Secondary | ICD-10-CM

## 2023-07-07 ENCOUNTER — Other Ambulatory Visit: Payer: Self-pay | Admitting: Internal Medicine

## 2023-07-24 ENCOUNTER — Other Ambulatory Visit: Payer: Self-pay

## 2023-07-24 MED ORDER — POTASSIUM CHLORIDE ER 10 MEQ PO TBCR: 10.0000 meq | EXTENDED_RELEASE_TABLET | Freq: Every day | ORAL | 2 refills | Status: DC

## 2023-08-05 ENCOUNTER — Other Ambulatory Visit: Payer: Self-pay | Admitting: Internal Medicine

## 2023-08-06 ENCOUNTER — Other Ambulatory Visit: Payer: Self-pay | Admitting: Internal Medicine

## 2023-09-01 ENCOUNTER — Other Ambulatory Visit: Payer: Self-pay | Admitting: Internal Medicine

## 2023-09-01 DIAGNOSIS — E669 Obesity, unspecified: Secondary | ICD-10-CM

## 2023-09-04 NOTE — Telephone Encounter (Signed)
 Spoke to pt, reports he is able to tolerate Zepbound 5 mg well without any side effects and ready to move to the next dose.

## 2023-09-18 ENCOUNTER — Telehealth: Payer: Self-pay | Admitting: Internal Medicine

## 2023-09-18 NOTE — Telephone Encounter (Signed)
*  STAT* If patient is at the pharmacy, call can be transferred to refill team.   1. Which medications need to be refilled? (please list name of each medication and dose if known) tirzepatide (ZEPBOUND) 7.5 MG/0.5ML Pen  2. Which pharmacy/location (including street and city if local pharmacy) is medication to be sent to? [facility] [place], [place] - [address]  3. Do they need a 30 day or 90 day supply?  Patient states he was out of town and waited too long to respond to a text he normally gets for the medication, so a next Rx is needed. Patient says he is due for his next dose on Sunday, 3/16, but the medication will have to be sent to him before then.

## 2023-09-20 MED ORDER — TIRZEPATIDE-WEIGHT MANAGEMENT 7.5 MG/0.5ML ~~LOC~~ SOLN: 7.5000 mg | SUBCUTANEOUS | 0 refills | Status: DC

## 2023-09-20 NOTE — Telephone Encounter (Signed)
 Patient identification verified by 2 forms. Marilynn Rail, RN    Called and spoke to patient  Informed patient Rx sent to pharmacy today  Patient verbalized understanding, no questions at this time

## 2023-09-20 NOTE — Telephone Encounter (Signed)
 Pt is calling back to check on status of refill

## 2023-09-21 ENCOUNTER — Other Ambulatory Visit: Payer: Self-pay | Admitting: Internal Medicine

## 2023-09-21 DIAGNOSIS — E669 Obesity, unspecified: Secondary | ICD-10-CM

## 2023-09-22 ENCOUNTER — Encounter: Payer: Self-pay | Admitting: Pharmacist

## 2023-09-22 MED ORDER — TIRZEPATIDE-WEIGHT MANAGEMENT 7.5 MG/0.5ML ~~LOC~~ SOLN: 7.5000 mg | SUBCUTANEOUS | 0 refills | Status: DC

## 2023-09-27 ENCOUNTER — Ambulatory Visit: Payer: PPO | Admitting: Internal Medicine

## 2023-10-06 ENCOUNTER — Other Ambulatory Visit: Payer: Self-pay | Admitting: Internal Medicine

## 2023-10-06 ENCOUNTER — Other Ambulatory Visit: Payer: Self-pay

## 2023-10-06 MED ORDER — HYDROCHLOROTHIAZIDE 25 MG PO TABS: 25.0000 mg | ORAL_TABLET | Freq: Every day | ORAL | 1 refills | Status: DC

## 2023-10-09 ENCOUNTER — Other Ambulatory Visit: Payer: Self-pay | Admitting: Internal Medicine

## 2023-10-11 ENCOUNTER — Other Ambulatory Visit: Payer: Self-pay | Admitting: Internal Medicine

## 2023-10-11 DIAGNOSIS — E669 Obesity, unspecified: Secondary | ICD-10-CM

## 2023-10-14 ENCOUNTER — Other Ambulatory Visit: Payer: Self-pay | Admitting: Internal Medicine

## 2023-10-14 DIAGNOSIS — E669 Obesity, unspecified: Secondary | ICD-10-CM

## 2023-10-18 ENCOUNTER — Other Ambulatory Visit: Payer: Self-pay | Admitting: Internal Medicine

## 2023-10-18 DIAGNOSIS — I4891 Unspecified atrial fibrillation: Secondary | ICD-10-CM

## 2023-10-18 NOTE — Telephone Encounter (Signed)
 Eliquis 5mg  refill request received. Patient is 73 years old, weight-99.8kg, Crea-0.90 on 01/19/23 via Care Everywhere from [facility], Diagnosis-Afib, and last seen by Dr. Rennis Golden on 03/24/23. Dose is appropriate based on dosing criteria. Will send in refill to requested pharmacy.

## 2023-10-24 NOTE — Telephone Encounter (Signed)
 Medication was refilled on 10/16/23 by pharmacist V. Lydia Sams

## 2023-11-01 ENCOUNTER — Other Ambulatory Visit: Payer: Self-pay | Admitting: Internal Medicine

## 2023-11-01 DIAGNOSIS — E669 Obesity, unspecified: Secondary | ICD-10-CM

## 2023-11-21 ENCOUNTER — Other Ambulatory Visit: Payer: Self-pay | Admitting: Internal Medicine

## 2023-11-21 DIAGNOSIS — E669 Obesity, unspecified: Secondary | ICD-10-CM

## 2023-11-22 ENCOUNTER — Ambulatory Visit: Admitting: Internal Medicine

## 2023-12-13 ENCOUNTER — Other Ambulatory Visit: Payer: Self-pay | Admitting: Internal Medicine

## 2023-12-13 DIAGNOSIS — E669 Obesity, unspecified: Secondary | ICD-10-CM

## 2023-12-19 ENCOUNTER — Encounter: Payer: Self-pay | Admitting: Internal Medicine

## 2023-12-19 MED ORDER — POTASSIUM CHLORIDE ER 20 MEQ PO TBCR
20.0000 meq | EXTENDED_RELEASE_TABLET | Freq: Every day | ORAL | 3 refills | Status: AC
Start: 2023-12-19 — End: ?

## 2023-12-19 NOTE — Addendum Note (Signed)
 Addended by: Guss Legacy on: 12/19/2023 11:39 AM   Modules accepted: Orders

## 2023-12-21 ENCOUNTER — Encounter: Payer: Self-pay | Admitting: Internal Medicine

## 2024-01-01 ENCOUNTER — Encounter: Payer: Self-pay | Admitting: Pharmacist Clinician (PhC)/ Clinical Pharmacy Specialist

## 2024-01-01 DIAGNOSIS — E669 Obesity, unspecified: Secondary | ICD-10-CM

## 2024-01-04 MED ORDER — ZEPBOUND 7.5 MG/0.5ML ~~LOC~~ SOLN: 7.5000 mg | SUBCUTANEOUS | 0 refills | Status: DC

## 2024-01-23 ENCOUNTER — Other Ambulatory Visit: Payer: Self-pay | Admitting: Internal Medicine

## 2024-01-23 DIAGNOSIS — E669 Obesity, unspecified: Secondary | ICD-10-CM

## 2024-01-25 DIAGNOSIS — I129 Hypertensive chronic kidney disease with stage 1 through stage 4 chronic kidney disease, or unspecified chronic kidney disease: Secondary | ICD-10-CM | POA: Diagnosis not present

## 2024-01-25 DIAGNOSIS — E7800 Pure hypercholesterolemia, unspecified: Secondary | ICD-10-CM | POA: Diagnosis not present

## 2024-01-25 DIAGNOSIS — Z1212 Encounter for screening for malignant neoplasm of rectum: Secondary | ICD-10-CM | POA: Diagnosis not present

## 2024-01-25 DIAGNOSIS — C61 Malignant neoplasm of prostate: Secondary | ICD-10-CM | POA: Diagnosis not present

## 2024-01-25 DIAGNOSIS — E7849 Other hyperlipidemia: Secondary | ICD-10-CM | POA: Diagnosis not present

## 2024-01-25 DIAGNOSIS — R7302 Impaired glucose tolerance (oral): Secondary | ICD-10-CM | POA: Diagnosis not present

## 2024-01-25 DIAGNOSIS — N182 Chronic kidney disease, stage 2 (mild): Secondary | ICD-10-CM | POA: Diagnosis not present

## 2024-02-01 DIAGNOSIS — N182 Chronic kidney disease, stage 2 (mild): Secondary | ICD-10-CM | POA: Diagnosis not present

## 2024-02-01 DIAGNOSIS — D6869 Other thrombophilia: Secondary | ICD-10-CM | POA: Diagnosis not present

## 2024-02-01 DIAGNOSIS — C61 Malignant neoplasm of prostate: Secondary | ICD-10-CM | POA: Diagnosis not present

## 2024-02-01 DIAGNOSIS — E7800 Pure hypercholesterolemia, unspecified: Secondary | ICD-10-CM | POA: Diagnosis not present

## 2024-02-01 DIAGNOSIS — I129 Hypertensive chronic kidney disease with stage 1 through stage 4 chronic kidney disease, or unspecified chronic kidney disease: Secondary | ICD-10-CM | POA: Diagnosis not present

## 2024-02-01 DIAGNOSIS — I480 Paroxysmal atrial fibrillation: Secondary | ICD-10-CM | POA: Diagnosis not present

## 2024-02-01 DIAGNOSIS — Z7901 Long term (current) use of anticoagulants: Secondary | ICD-10-CM | POA: Diagnosis not present

## 2024-02-01 DIAGNOSIS — Z0000 Encounter for general adult medical examination without abnormal findings: Secondary | ICD-10-CM | POA: Diagnosis not present

## 2024-02-01 DIAGNOSIS — Z1331 Encounter for screening for depression: Secondary | ICD-10-CM | POA: Diagnosis not present

## 2024-02-01 DIAGNOSIS — R7302 Impaired glucose tolerance (oral): Secondary | ICD-10-CM | POA: Diagnosis not present

## 2024-02-01 DIAGNOSIS — R82998 Other abnormal findings in urine: Secondary | ICD-10-CM | POA: Diagnosis not present

## 2024-02-01 DIAGNOSIS — E663 Overweight: Secondary | ICD-10-CM | POA: Diagnosis not present

## 2024-02-01 DIAGNOSIS — R809 Proteinuria, unspecified: Secondary | ICD-10-CM | POA: Diagnosis not present

## 2024-02-01 DIAGNOSIS — Z1339 Encounter for screening examination for other mental health and behavioral disorders: Secondary | ICD-10-CM | POA: Diagnosis not present

## 2024-02-01 DIAGNOSIS — J309 Allergic rhinitis, unspecified: Secondary | ICD-10-CM | POA: Diagnosis not present

## 2024-02-02 ENCOUNTER — Other Ambulatory Visit: Payer: Self-pay | Admitting: Internal Medicine

## 2024-02-05 ENCOUNTER — Ambulatory Visit: Attending: Internal Medicine | Admitting: Internal Medicine

## 2024-02-05 VITALS — BP 144/96 | HR 80 | Ht 71.0 in | Wt 206.0 lb

## 2024-02-05 DIAGNOSIS — I4510 Unspecified right bundle-branch block: Secondary | ICD-10-CM

## 2024-02-05 DIAGNOSIS — Z7901 Long term (current) use of anticoagulants: Secondary | ICD-10-CM

## 2024-02-05 DIAGNOSIS — I4821 Permanent atrial fibrillation: Secondary | ICD-10-CM | POA: Diagnosis not present

## 2024-02-05 DIAGNOSIS — I10 Essential (primary) hypertension: Secondary | ICD-10-CM

## 2024-02-05 NOTE — Patient Instructions (Signed)
 Medication Instructions:  Your physician recommends that you continue on your current medications as directed. Please refer to the Current Medication list given to you today.  *If you need a refill on your cardiac medications before your next appointment, please call your pharmacy*  Follow-Up: At [facility], you and your health needs are our priority.  As part of our continuing mission to provide you with exceptional heart care, our providers are all part of one team.  This team includes your primary Cardiologist (physician) and Advanced Practice Providers or APPs (Physician Assistants and Nurse Practitioners) who all work together to provide you with the care you need, when you need it.  Your next appointment:   1 year with Dr. Maximo Spar

## 2024-02-05 NOTE — Progress Notes (Signed)
 OFFICE NOTE  Chief Complaint:  Routine follow-up  Primary Care Physician: Tisovec, Charlie ORN, MD  HPI:  Trevor [name]  is a pleasant 73 year old gentleman with history of resistant hypertension, on numerous medications including clonidine , Tribenzor, hydralazine , and Bystolic . Blood pressures at home seem to be well controlled; however, he does have additional white coat syndrome. Today in the office, his blood pressure was 180/110 and did not come down after another recheck toward the end of the visit. Blood pressures at home, per a diary that he has kept, range between the 120s and 130s up to 160s, with an average blood pressure between 135 and 140 systolic and 80 to 90 diastolic. Overall, I think this is pretty good control. He is asymptomatic. He denies any chest pain, worsening shortness of breath, palpitations, presyncope, or syncopal symptoms. He tells me that he had a recent physical with you and had a lipid profile checked, which was within normal limits. Recent laboratory work was received from your office which demonstrates total cholesterol 128, triglycerides 158, HDL 40 and LDL 56.  Hemoglobin A1c was 5.6.  Some Trevor [name] back in the office today. He is currently without any complaints. He recently had a urinary tract infection and is on anabolic for this. He denies any chest pain or worsening shortness of breath. Blood pressure has been well controlled. He's had no side effects from his cholesterol medicines.  01/07/2016  Trevor [name] presents today for annual follow-up. Overall he seems to be doing quite well. He had a recent office visit with his primary care provider and a clean bill of health. Blood pressure has been reasonably well controlled given the number of medicines he is on. His blood pressures at home range from the mid 130s to 140s although in the office today it was elevated to 166/106. I rechecked the blood pressure at the end of the visit and was 150/80. He denies any  chest pain or shortness of breath.  01/02/2017  I saw Trevor [name] today in follow-up. He reports continuing to feel well. He denies any chest pain or worsening shortness of breath. Occasionally notices heart rate is elevated. He's had no worsening fatigue and continues to work outside in [employer] job. Blood pressure is elevated 174/107 however came down on recheck. His blood pressures at home were reviewed personally indicating good control between the 120s and 140 systolic and diastolics in the 70s. He is on a number of medications for this. A routine EKG today was performed which indicated an irregularly irregular rhythm that was clearly atrial fibrillation. He has no history of atrial fibrillation and this is a new diagnosis. He denies any chest pain.   03/06/2017  Trevor [name] returns today for follow-up. He underwent successful cardioversion by myself on 02/01/2017. Since then he's maintained sinus rhythm. He reports some improvement in his fatigue. Blood pressure remains elevated today and was 210/112. He reports compliance with his medication. He is now on 5 different blood pressure medications including clonidine  twice daily. He denies any end organ side effects such as chest pain, shortness of breath, visual changes or decreased urination.  12/07/2017  Trevor [name] was seen today in follow-up.  Over the last year he is done very well.  He seems to be maintaining sinus rhythm.  He denies chest pain or worsening shortness of breath.  He has no significant fatigue.  Blood pressure initially was 178/90, however I suspect he has significant whitecoat hypertension.  I rechecked it  came down to 160/80.  I reviewed the list of home blood pressure readings, which were generally in the 120-130s systolic with some occasional 859 and 150 readings.  He denies any bleeding issues on Eliquis .  Recent labs demonstrated total cholesterol 118, HDL 39, LDL 57 and triglycerides 110.  03/24/2023  Trevor [name] returns  today for follow-up.  His blood pressure was significantly elevated today 162/104.  He reports having taken his medicines right before he came to the office.  He did bring home blood pressure readings which show much better control.  I repeated the blood pressure and it still about 180/100.  Not clear why his blood pressure is elevated today denies any chest pain, blurred vision headache or other associated symptoms.  He does have a new right bundle branch block and EKG today which does show A-fib.  His last echo was in 2018.  He did have recent blood work from his primary care provider in July showing A1c 6.0%, total cholesterol 130, triglycerides 124, HDL 38 and LDL 67.  Creatinine was normal at 0.9, liver enzymes were normal and CBC was normal as well.  02/05/2024  Trevor [name] returns today for follow-up.  He continues to do very well.  Since I last saw him he is now down about 20 pounds and has been on Zepbound .  He continues to exercise and eat well.  His A1c previously was 6.0 but now is down to 5.6.  Recent labs also showed excellent lipid profile with total cholesterol 109, HDL 37, triglycerides 161 and LDL 40.  He is asymptomatic denying chest pain or shortness of breath.  He remains in atrial fibrillation which is permanent and a right bundle branch block.  PMHx:  Past Medical History:  Diagnosis Date   Allergy    seasonal   Dyslipidemia    Dysrhythmia    ATRIAL FIB   Hyperlipidemia    preventitive due to brothers heart issues in his  46's   Hypertension    Prostate cancer [facility])     Past Surgical History:  Procedure Laterality Date   CARDIOVERSION N/A 02/01/2017   Procedure: CARDIOVERSION;  Surgeon: Mona Vinie BROCKS, MD;  Location: [facility];  Service: Cardiovascular;  Laterality: N/A;   NM MYOCAR PERF WALL MOTION  2011   persantine myoview - normal perfusion, EF 64%, low risk scan   PROSTATE BIOPSY  04/04/2019   RADIOACTIVE SEED IMPLANT N/A 06/03/2019   Procedure: RADIOACTIVE  SEED IMPLANT/BRACHYTHERAPY IMPLANT WITH CYSTOSCOPY;  Surgeon: Renda Glance, MD;  Location: [facility] [place];  Service: Urology;  Laterality: N/A;   SPACE OAR INSTILLATION N/A 06/03/2019   Procedure: SPACE OAR INSTILLATION;  Surgeon: Renda Glance, MD;  Location: [facility];  Service: Urology;  Laterality: N/A;    FAMHx:  Family History  Problem Relation Age of Onset   Breast cancer Mother    Heart disease Father    Prostate cancer Father    Kidney disease Father    Heart disease Maternal Grandmother    Heart disease Maternal Grandfather    Heart attack Paternal Grandmother    Lupus Paternal Grandfather    Heart disease Brother        49's   Colon cancer Neg Hx     SOCHx:   reports that he has quit smoking. His smoking use included cigars. He has quit using smokeless tobacco. He reports that he does not drink alcohol  and does not use drugs.  ALLERGIES:  No Known Allergies  ROS: Pertinent items noted in HPI and remainder of comprehensive ROS otherwise negative.  HOME MEDS: Current Outpatient Medications  Medication Sig Dispense Refill   amLODipine  (NORVASC ) 10 MG tablet TAKE 1 TABLET BY MOUTH DAILY 90 tablet 2   atorvastatin  (LIPITOR) 10 MG tablet TAKE 1 TABLET(10 MG) BY MOUTH DAILY AT 6 PM 90 tablet 2   cloNIDine  (CATAPRES ) 0.2 MG tablet TAKE 1 TABLET(0.2 MG) BY MOUTH TWICE DAILY 180 tablet 0   ELIQUIS  5 MG TABS tablet TAKE 1 TABLET(5 MG) BY MOUTH TWICE DAILY 180 tablet 1   fexofenadine (ALLEGRA ALLERGY) 180 MG tablet Take 1 tablet po qd Oral     hydrALAZINE  (APRESOLINE ) 50 MG tablet TAKE 1 TABLET BY MOUTH TWICE DAILY 180 tablet 3   hydrochlorothiazide  (HYDRODIURIL ) 25 MG tablet Take 1 tablet (25 mg total) by mouth daily. 90 tablet 1   irbesartan  (AVAPRO ) 300 MG tablet TAKE 1 TABLET BY MOUTH DAILY 90 tablet 1   Nebivolol  HCl 20 MG TABS TAKE 1 TABLET BY MOUTH TWICE DAILY. 180 tablet 0   potassium chloride  20 MEQ TBCR Take 1 tablet (20 mEq total) by  mouth daily. 90 tablet 3   tamsulosin (FLOMAX) 0.4 MG CAPS capsule Take 0.4 mg by mouth daily after supper.      tirzepatide  (ZEPBOUND ) 7.5 MG/0.5ML injection vial INJECT 0.5 ML (7.5 MG) UNDER THE SKIN ONCE WEEKLY (0.5ML= 50 UNITS) 2 mL 3   No current facility-administered medications for this visit.    LABS/IMAGING: No results found for this or any previous visit (from the past 48 hours). No results found.  VITALS: BP (!) 144/96 (BP Location: Right Arm, Patient Position: Sitting, Cuff Size: Normal)   Pulse 80   Ht 5' 11 (1.803 m)   Wt 206 lb (93.4 kg)   SpO2 97%   BMI 28.73 kg/m   EXAM: General appearance: alert and no distress Neck: no carotid bruit, no JVD and thyroid  not enlarged, symmetric, no tenderness/mass/nodules Lungs: clear to auscultation bilaterally Heart: regular rate and rhythm, S1, S2 normal, no murmur, click, rub or gallop Abdomen: soft, non-tender; bowel sounds normal; no masses,  no organomegaly Extremities: extremities normal, atraumatic, no cyanosis or edema Pulses: 2+ and symmetric Skin: Skin color, texture, turgor normal. No rashes or lesions Neurologic: Grossly normal Psych: Pleasant  EKG: EKG Interpretation Date/Time:  Monday February 05 2024 08:15:32 EDT Ventricular Rate:  80 PR Interval:    QRS Duration:  134 QT Interval:  406 QTC Calculation: 468 R Axis:   28  Text Interpretation: Atrial fibrillation Right bundle branch block Inferior infarct (cited on or before 01-Feb-2017) When compared with ECG of 24-Mar-2023 08:06, No significant change since last tracing Right bundle branch block is now Present Confirmed by Mona Kent [phone]) on 02/05/2024 8:22:07 AM    ASSESSMENT: Permanent atrial fibrillation - CHADSVASC 2 RBBB Hypertension Dyslipidemia Obesity Prediabetes-A1c 6.0%  PLAN: 1.   Trevor [name] has had about 20 pound weight loss with Zepbound  in addition to diet and lifestyle changes.  He seems to be tolerating the 7.5 mg dose and is  hesitant to increase it more because he does get some side effects a few days after the injection.  He is in A-fib which is likely permanent at this point and anticoagulated on Eliquis  without bleeding issues.  Blood pressure was elevated today in the office however his home blood pressure readings range between 110 and 130 systolic.  Cholesterol is very well-controlled with an LDL of 40.  Overall he  seems to be doing very well.  No changes to his medicines today  Follow-up with me annually or sooner as necessary.  Vinie KYM Maxcy, MD, [facility], FNLA, FACP  [place]  [facility]  Medical Director of the Advanced Lipid Disorders &  Cardiovascular Risk Reduction Clinic Diplomate of the American Board of Clinical Lipidology Attending Cardiologist  Direct Dial: [phone]  Fax: [phone]  Website:  [email or web address]   Vinie BROCKS Mahkai Fangman 02/05/2024, 8:22 AM

## 2024-02-12 DIAGNOSIS — Z1211 Encounter for screening for malignant neoplasm of colon: Secondary | ICD-10-CM | POA: Diagnosis not present

## 2024-02-23 LAB — COLOGUARD: COLOGUARD: POSITIVE — AB

## 2024-02-29 ENCOUNTER — Encounter: Payer: Self-pay | Admitting: Gastroenterology

## 2024-03-07 ENCOUNTER — Telehealth: Payer: Self-pay | Admitting: *Deleted

## 2024-03-07 ENCOUNTER — Ambulatory Visit (INDEPENDENT_AMBULATORY_CARE_PROVIDER_SITE_OTHER): Admitting: Gastroenterology

## 2024-03-07 ENCOUNTER — Encounter: Payer: Self-pay | Admitting: Gastroenterology

## 2024-03-07 VITALS — BP 148/82 | HR 88 | Ht 71.0 in | Wt 207.2 lb

## 2024-03-07 DIAGNOSIS — Z1211 Encounter for screening for malignant neoplasm of colon: Secondary | ICD-10-CM

## 2024-03-07 DIAGNOSIS — Z7901 Long term (current) use of anticoagulants: Secondary | ICD-10-CM

## 2024-03-07 MED ORDER — NA SULFATE-K SULFATE-MG SULF 17.5-3.13-1.6 GM/177ML PO SOLN: 1.0000 | Freq: Once | ORAL | 0 refills | Status: AC

## 2024-03-07 NOTE — Telephone Encounter (Signed)
 Pharmacy please advise on holding Eliquis  prior to colonoscopy scheduled for 03/30/2024. Thank you.

## 2024-03-07 NOTE — Telephone Encounter (Signed)
 Patient with diagnosis of atrial fibrillation on Eliquis  for anticoagulation.    What type of surgery is being performed?     Colonoscopy   When is this surgery scheduled?     04/03/2024    CHA2DS2-VASc Score = 2   This indicates a 2.2% annual risk of stroke. The patient's score is based upon: CHF History: 0 HTN History: 1 Diabetes History: 0 Stroke History: 0 Vascular Disease History: 0 Age Score: 1 Gender Score: 0    No current labs, will need BMET, CBC  Patient has not had an Afib/aflutter ablation within the last 3 months or DCCV within the last 30 days  **This guidance is not considered finalized until pre-operative APP has relayed final recommendations.**

## 2024-03-07 NOTE — Telephone Encounter (Signed)
 Labs in OV from [facility] 01/25/24  CrCl 81   Platelet count 189   Per office protocol, patient can hold Eliquis  for 2 days prior to procedure.   Patient will not need bridging with Lovenox (enoxaparin) around procedure.  **This guidance is not considered finalized until pre-operative APP has relayed final recommendations.**

## 2024-03-07 NOTE — Progress Notes (Unsigned)
 Trevor [name] [id number] 1950/07/27   Chief Complaint:  Referring Provider: Vernadine Charlie LELON, MD Primary GI MD: Sampson  HPI: Trevor [name] is a 73 y.o. male with past medical history of HTN, HLD, A. Fib, prostate cancer s/p radioactive seed implant  who presents today for a complaint of *** .    Per referral note has had positive Cologuard (end of July) On eliquis  and zepbound  Any prior colonoscopy?  No melena or blood in stool No prior colonoscopy This was second Cologuard test  Has started on Zepbound , and has had diarrhea with this When he did Cologuard was having diarrhea at that time  No family history of colon cancer or polyps  Does have annual physical with PCP, had labs in July, states labs were good Has hx of prostate cancer, PSA recently was good  No n/v With zepbound  has gas and discomfort/tightness, otherwise no abd pain In the last couple weeks doing better Started zepbound  right before first of the year  No GERD sx  No MI/stroke history No chest pain or SOB  No constipation issues Bowel movement at least weekly or twice a week, normal for him  Recent PCP labs seen in care everywhere showed normal CBC, no anemia, high triglycerides, normal liver enzymes   Previous GI Procedures/Imaging      Past Medical History:  Diagnosis Date   Allergy    seasonal   Dyslipidemia    Dysrhythmia    ATRIAL FIB   Hyperlipidemia    preventitive due to brothers heart issues in his  54's   Hypertension    Prostate cancer [facility])     Past Surgical History:  Procedure Laterality Date   CARDIOVERSION N/A 02/01/2017   Procedure: CARDIOVERSION;  Surgeon: Mona Vinie BROCKS, MD;  Location: [facility];  Service: Cardiovascular;  Laterality: N/A;   NM MYOCAR PERF WALL MOTION  2011   persantine myoview - normal perfusion, EF 64%, low risk scan   PROSTATE BIOPSY  04/04/2019   RADIOACTIVE SEED IMPLANT N/A 06/03/2019   Procedure: RADIOACTIVE SEED  IMPLANT/BRACHYTHERAPY IMPLANT WITH CYSTOSCOPY;  Surgeon: Renda Glance, MD;  Location: [facility];  Service: Urology;  Laterality: N/A;   SPACE OAR INSTILLATION N/A 06/03/2019   Procedure: SPACE OAR INSTILLATION;  Surgeon: Renda Glance, MD;  Location: [facility];  Service: Urology;  Laterality: N/A;    Current Outpatient Medications  Medication Sig Dispense Refill   amLODipine  (NORVASC ) 10 MG tablet TAKE 1 TABLET BY MOUTH DAILY 90 tablet 2   atorvastatin  (LIPITOR) 10 MG tablet TAKE 1 TABLET(10 MG) BY MOUTH DAILY AT 6 PM 90 tablet 2   cloNIDine  (CATAPRES ) 0.2 MG tablet TAKE 1 TABLET(0.2 MG) BY MOUTH TWICE DAILY 180 tablet 0   ELIQUIS  5 MG TABS tablet TAKE 1 TABLET(5 MG) BY MOUTH TWICE DAILY 180 tablet 1   fexofenadine (ALLEGRA ALLERGY) 180 MG tablet Take 1 tablet po qd Oral     hydrALAZINE  (APRESOLINE ) 50 MG tablet TAKE 1 TABLET BY MOUTH TWICE DAILY 180 tablet 3   hydrochlorothiazide  (HYDRODIURIL ) 25 MG tablet Take 1 tablet (25 mg total) by mouth daily. 90 tablet 1   irbesartan  (AVAPRO ) 300 MG tablet TAKE 1 TABLET BY MOUTH DAILY 90 tablet 1   Nebivolol  HCl 20 MG TABS TAKE 1 TABLET BY MOUTH TWICE DAILY. 180 tablet 0   potassium chloride  20 MEQ TBCR Take 1 tablet (20 mEq total) by mouth daily. 90 tablet 3   tamsulosin (FLOMAX) 0.4 MG  CAPS capsule Take 0.4 mg by mouth daily after supper.      tirzepatide  (ZEPBOUND ) 7.5 MG/0.5ML injection vial INJECT 0.5 ML (7.5 MG) UNDER THE SKIN ONCE WEEKLY (0.5ML= 50 UNITS) 2 mL 3   No current facility-administered medications for this visit.    Allergies as of 03/07/2024   (No Known Allergies)    Family History  Problem Relation Age of Onset   Breast cancer Mother    Heart disease Father    Prostate cancer Father    Kidney disease Father    Heart disease Maternal Grandmother    Heart disease Maternal Grandfather    Heart attack Paternal Grandmother    Lupus Paternal Grandfather    Heart disease Brother         62's   Colon cancer Neg Hx     Social History   Tobacco Use   Smoking status: Former    Types: Cigars   Smokeless tobacco: Former   Tobacco comments:    Manufacturing systems engineer.    Vaping Use   Vaping status: Never Used  Substance Use Topics   Alcohol  use: No   Drug use: No     Review of Systems:    Constitutional: No weight loss, fever, chills, weakness or fatigue Eyes: No change in vision Ears, Nose, Throat:  No change in hearing or congestion Skin: No rash or itching Cardiovascular: No chest pain, chest pressure or palpitations   Respiratory: No SOB or cough Gastrointestinal: See HPI and otherwise negative Genitourinary: No dysuria or change in urinary frequency Neurological: No headache, dizziness or syncope Musculoskeletal: No new muscle or joint pain Hematologic: No bleeding or bruising    Physical Exam:  Vital signs: BP (!) 148/82   Pulse 88   Ht 5' 11 (1.803 m)   Wt 207 lb 4 oz (94 kg)   BMI 28.91 kg/m   Constitutional: NAD, Well developed, Well nourished, alert and cooperative Head:  Normocephalic and atraumatic.  Eyes: No scleral icterus. Conjunctiva pink. Mouth: No oral lesions. Respiratory: Respirations even and unlabored. Lungs clear to auscultation bilaterally.  No wheezes, crackles, or rhonchi.  Cardiovascular:  Regular rate and rhythm. No murmurs. No peripheral edema. Gastrointestinal:  Soft, nondistended, nontender. No rebound or guarding. Normal bowel sounds. No appreciable masses or hepatomegaly. Rectal:  Not performed.  Neurologic:  Alert and oriented x4;  grossly normal neurologically.  Skin:   Dry and intact without significant lesions or rashes. Psychiatric: Oriented to person, place and time. Demonstrates good judgement and reason without abnormal affect or behaviors.   RELEVANT LABS AND IMAGING: CBC    Component Value Date/Time   WBC 5.2 09/03/2021 0852   WBC 5.9 05/30/2019 1239   RBC 5.83 (H) 09/03/2021 0852   RBC 5.87 (H) 05/30/2019 1239    HGB 17.0 09/03/2021 0852   HCT 50.6 09/03/2021 0852   PLT 180 09/03/2021 0852   MCV 87 09/03/2021 0852   MCH 29.2 09/03/2021 0852   MCH 29.8 05/30/2019 1239   MCHC 33.6 09/03/2021 0852   MCHC 33.8 05/30/2019 1239   RDW 14.0 09/03/2021 0852    CMP     Component Value Date/Time   NA 141 10/18/2021 0811   K 3.4 (L) 10/18/2021 0811   CL 101 10/18/2021 0811   CO2 27 10/18/2021 0811   GLUCOSE 121 (H) 10/18/2021 0811   GLUCOSE 120 (H) 05/30/2019 1239   BUN 17 10/18/2021 0811   CREATININE 0.86 10/18/2021 0811   CALCIUM  9.0 10/18/2021 9188  PROT 6.8 09/03/2021 0852   ALBUMIN 4.4 09/03/2021 0852   AST 15 09/03/2021 0852   ALT 17 09/03/2021 0852   ALKPHOS 66 09/03/2021 0852   BILITOT 0.6 09/03/2021 0852   GFRNONAA >60 05/30/2019 1239   GFRAA >60 05/30/2019 1239   Echocardiogram 04/05/2023 1. Left ventricular ejection fraction, by estimation, is 60 to 65% . The left ventricle has normal function. The left ventricle has no regional wall motion abnormalities. Left ventricular diastolic parameters are indeterminate.  2. Right ventricular systolic function is normal. The right ventricular size is normal. There is normal pulmonary artery systolic pressure.  3. Left atrial size was mildly dilated.  4. The mitral valve is normal in structure. Mild mitral valve regurgitation. No evidence of mitral stenosis.  5. The aortic valve is normal in structure. Aortic valve regurgitation is not visualized. No aortic stenosis is present.  6. The inferior vena cava is normal in size with greater than 50% respiratory variability, suggesting right atrial pressure of 3 mmHg.  Assessment/Plan:    Colonoscopy Request eliquis  hold  Dr. Legrand Camie Furbish, PA-C [facility] 03/07/2024, 8:42 AM  Patient Care Team: Tisovec, Charlie ORN, MD as PCP - General (Internal Medicine) Mona Vinie BROCKS, MD as PCP - Cardiology (Cardiology) Grayce Buddle, RN Nurse Navigator as Registered Nurse (Medical  Oncology)

## 2024-03-07 NOTE — Telephone Encounter (Signed)
 [facility] Pre-operative Risk Assessment     Request for surgical clearance:     Endoscopy Procedure  What type of surgery is being performed?     Colonoscopy   When is this surgery scheduled?     04/03/2024  What type of clearance is required ?   Pharmacy  Are there any medications that need to be held prior to surgery and how long? ELIQUIS  2  DAYS  Practice name and name of physician performing surgery?      [facility]  What is your office phone and fax number?      Phone- [phone]  Fax- [phone]  Anesthesia type (None, local, MAC, general) ?       MAC   Please route your response to Grayce Loge CMA

## 2024-03-07 NOTE — Patient Instructions (Signed)
 You have been scheduled for a colonoscopy. Please follow written instructions given to you at your visit today.   If you use inhalers (even only as needed), please bring them with you on the day of your procedure.  DO NOT TAKE 7 DAYS PRIOR TO TEST- Trulicity (dulaglutide) Ozempic, Wegovy (semaglutide) Mounjaro (tirzepatide) Bydureon Bcise (exanatide extended release)  DO NOT TAKE 1 DAY PRIOR TO YOUR TEST Rybelsus (semaglutide) Adlyxin (lixisenatide) Victoza (liraglutide) Byetta (exanatide) ___________________________________________________________________________    Due to recent changes in healthcare laws, you may see the results of your imaging and laboratory studies on MyChart before your provider has had a chance to review them.  We understand that in some cases there may be results that are confusing or concerning to you. Not all laboratory results come back in the same time frame and the provider may be waiting for multiple results in order to interpret others.  Please give us  48 hours in order for your provider to thoroughly review all the results before contacting the office for clarification of your results.    I appreciate the  opportunity to care for you  Thank You   Camie Heinz,PA-C

## 2024-03-07 NOTE — Telephone Encounter (Signed)
   Patient Name: Trevor [name]  DOB: 1950/12/09 MRN: [id number]  Primary Cardiologist: Vinie JAYSON Maxcy, MD  Clinical pharmacists have reviewed the patient's past medical history, labs, and current medications as part of preoperative protocol coverage. The following recommendations have been made:  Per office protocol, patient can hold Eliquis  for 2 days prior to procedure.   Patient will not need bridging with Lovenox (enoxaparin) around procedure.   I will route this recommendation to the requesting party via Epic fax function and remove from pre-op pool.  Please call with questions.  Wyn Raddle, Jackee Shove, NP 03/07/2024, 2:14 PM

## 2024-03-08 ENCOUNTER — Encounter: Payer: Self-pay | Admitting: Gastroenterology

## 2024-03-08 NOTE — Telephone Encounter (Signed)
 Called patient and informed him ok to hold Eliquis  2 days before his procedure and reminded him to hold his Zepbound  7 days before his procedure , patient said he understands

## 2024-03-20 NOTE — Progress Notes (Signed)
 ____________________________________________________________  Attending physician addendum:  Thank you for sending this case to me. I have reviewed the entire note and agree with the plan.   Victory Brand, MD  ____________________________________________________________

## 2024-03-25 DIAGNOSIS — L821 Other seborrheic keratosis: Secondary | ICD-10-CM | POA: Diagnosis not present

## 2024-03-25 DIAGNOSIS — L570 Actinic keratosis: Secondary | ICD-10-CM | POA: Diagnosis not present

## 2024-03-25 DIAGNOSIS — L565 Disseminated superficial actinic porokeratosis (DSAP): Secondary | ICD-10-CM | POA: Diagnosis not present

## 2024-03-25 DIAGNOSIS — Z85828 Personal history of other malignant neoplasm of skin: Secondary | ICD-10-CM | POA: Diagnosis not present

## 2024-03-25 DIAGNOSIS — D225 Melanocytic nevi of trunk: Secondary | ICD-10-CM | POA: Diagnosis not present

## 2024-03-25 DIAGNOSIS — D2261 Melanocytic nevi of right upper limb, including shoulder: Secondary | ICD-10-CM | POA: Diagnosis not present

## 2024-03-25 DIAGNOSIS — D2262 Melanocytic nevi of left upper limb, including shoulder: Secondary | ICD-10-CM | POA: Diagnosis not present

## 2024-04-02 ENCOUNTER — Other Ambulatory Visit: Payer: Self-pay | Admitting: Internal Medicine

## 2024-04-03 ENCOUNTER — Ambulatory Visit (AMBULATORY_SURGERY_CENTER): Admitting: Gastroenterology

## 2024-04-03 ENCOUNTER — Encounter: Payer: Self-pay | Admitting: Gastroenterology

## 2024-04-03 VITALS — BP 112/63 | HR 77 | Temp 97.6°F | Resp 12 | Ht 71.0 in | Wt 207.0 lb

## 2024-04-03 DIAGNOSIS — K648 Other hemorrhoids: Secondary | ICD-10-CM

## 2024-04-03 DIAGNOSIS — D128 Benign neoplasm of rectum: Secondary | ICD-10-CM | POA: Diagnosis not present

## 2024-04-03 DIAGNOSIS — R195 Other fecal abnormalities: Secondary | ICD-10-CM | POA: Diagnosis not present

## 2024-04-03 DIAGNOSIS — Z1211 Encounter for screening for malignant neoplasm of colon: Secondary | ICD-10-CM | POA: Diagnosis not present

## 2024-04-03 DIAGNOSIS — I10 Essential (primary) hypertension: Secondary | ICD-10-CM | POA: Diagnosis not present

## 2024-04-03 DIAGNOSIS — Q438 Other specified congenital malformations of intestine: Secondary | ICD-10-CM

## 2024-04-03 MED ORDER — SODIUM CHLORIDE 0.9 % IV SOLN: 500.0000 mL | Freq: Once | INTRAVENOUS | Status: DC

## 2024-04-03 NOTE — Progress Notes (Signed)
 Patient needs 3 month repeat colonoscopy and patient requested January appt. Recall placed - MD aware.

## 2024-04-03 NOTE — Progress Notes (Signed)
 Report given to PACU, vss

## 2024-04-03 NOTE — Patient Instructions (Addendum)
 Handouts given: Polyps, Hemorrhoids Resume previous diet. Resume Eliquis  (apixaban ) at prior dose tomorrow. Await pathology results. Repeat colonoscopy within 3 months due to poor preparation. GoLytely prep for next exam. Patient requests January 2026 appt - recall placed.  YOU HAD AN ENDOSCOPIC PROCEDURE TODAY AT THE [facility]:   Refer to the procedure report that was given to you for any specific questions about what was found during the examination.  If the procedure report does not answer your questions, please call your gastroenterologist to clarify.  If you requested that your care partner not be given the details of your procedure findings, then the procedure report has been included in a sealed envelope for you to review at your convenience later.  YOU SHOULD EXPECT: Some feelings of bloating in the abdomen. Passage of more gas than usual.  Walking can help get rid of the air that was put into your GI tract during the procedure and reduce the bloating. If you had a lower endoscopy (such as a colonoscopy or flexible sigmoidoscopy) you may notice spotting of blood in your stool or on the toilet paper. If you underwent a bowel prep for your procedure, you may not have a normal bowel movement for a few days.  Please Note:  You might notice some irritation and congestion in your nose or some drainage.  This is from the oxygen used during your procedure.  There is no need for concern and it should clear up in a day or so.  SYMPTOMS TO REPORT IMMEDIATELY:  Following lower endoscopy (colonoscopy or flexible sigmoidoscopy):  Excessive amounts of blood in the stool  Significant tenderness or worsening of abdominal pains  Swelling of the abdomen that is new, acute  Fever of 100F or higher  For urgent or emergent issues, a gastroenterologist can be reached at any hour by calling [phone]. Do not use MyChart messaging for urgent concerns.    DIET:  We do recommend a small  meal at first, but then you may proceed to your regular diet.  Drink plenty of fluids but you should avoid alcoholic beverages for 24 hours.  ACTIVITY:  You should plan to take it easy for the rest of today and you should NOT DRIVE or use heavy machinery until tomorrow (because of the sedation medicines used during the test).    FOLLOW UP: Our staff will call the number listed on your records the next business day following your procedure.  We will call around 7:15- 8:00 am to check on you and address any questions or concerns that you may have regarding the information given to you following your procedure. If we do not reach you, we will leave a message.     If any biopsies were taken you will be contacted by phone or by letter within the next 1-3 weeks.  Please call us  at [phone] if you have not heard about the biopsies in 3 weeks.    SIGNATURES/CONFIDENTIALITY: You and/or your care partner have signed paperwork which will be entered into your electronic medical record.  These signatures attest to the fact that that the information above on your After Visit Summary has been reviewed and is understood.  Full responsibility of the confidentiality of this discharge information lies with you and/or your care-partner.

## 2024-04-03 NOTE — Progress Notes (Signed)
 No significant changes to clinical history since GI office visit on 03/07/24. Colonoscopy today for a positive Cologuard in a patient on Eliquis  The patient is appropriate for an endoscopic procedure in the ambulatory setting.  - Victory Brand, MD

## 2024-04-03 NOTE — Progress Notes (Signed)
 Called to room to assist during endoscopic procedure.  Patient ID and intended procedure confirmed with present staff. Received instructions for my participation in the procedure from the performing physician.

## 2024-04-03 NOTE — Op Note (Signed)
 [facility] Patient Name: Trevor [name] Procedure Date: 04/03/2024 10:10 AM MRN: [id number] Endoscopist: Victory L. Legrand , MD, [id number] Age: 73 Referring MD:  Date of Birth: March 28, 1951 Gender: Male Account #: [id number] Procedure:                Colonoscopy Indications:              Positive Cologuard test Medicines:                Monitored Anesthesia Care Procedure:                Pre-Anesthesia Assessment:                           - Prior to the procedure, a History and Physical                            was performed, and patient medications and                            allergies were reviewed. The patient's tolerance of                            previous anesthesia was also reviewed. The risks                            and benefits of the procedure and the sedation                            options and risks were discussed with the patient.                            All questions were answered, and informed consent                            was obtained. Prior Anticoagulants: The patient has                            taken Eliquis  (apixaban ), last dose was 2 days                            prior to procedure. ASA Grade Assessment: II - A                            patient with mild systemic disease. After reviewing                            the risks and benefits, the patient was deemed in                            satisfactory condition to undergo the procedure.                           After obtaining informed consent, the colonoscope  was passed under direct vision. Throughout the                            procedure, the patient's blood pressure, pulse, and                            oxygen saturations were monitored continuously. The                            CF [id number] #[id number] was introduced through the anus                            and advanced to the the cecum, identified by                            appendiceal  orifice and ileocecal valve. The                            colonoscopy was performed with difficulty due to                            poor bowel prep and a redundant colon. Successful                            completion of the procedure was aided by using                            manual pressure, straightening and shortening the                            scope to obtain bowel loop reduction and lavage.                            The patient tolerated the procedure well. The                            quality of the bowel preparation was poor, with                            opaque liquid and fibrous debris remaining despite                            extensive lavage. Visualization was significantly                            improved after lavage but still with limitations.                            The ileocecal valve, appendiceal orifice, and                            rectum were photographed. The bowel preparation  used was SUPREP. Scope In: 10:16:49 AM Scope Out: 10:36:01 AM Scope Withdrawal Time: 0 hours 15 minutes 3 seconds  Total Procedure Duration: 0 hours 19 minutes 12 seconds  Findings:                 The perianal and digital rectal examinations were                            normal.                           A large amount of semi-liquid stool and fibrous                            debris was found in the entire colon.                           A 6 mm polyp was found in the proximal rectum. The                            polyp was semi-pedunculated and somewhat friable                            with slight oozing from lavage. The polyp was                            removed with a hot snare. Resection and retrieval                            were complete.                           The colon (entire examined portion) was                            significantly redundant.                           Internal hemorrhoids were found.                            The exam was otherwise without abnormality on                            direct and retroflexion views. Complications:            No immediate complications. Estimated Blood Loss:     Estimated blood loss was minimal. Impression:               - Preparation of the colon was poor.                           - Stool in the entire examined colon.                           - One 6 mm polyp in the proximal rectum, removed  with a hot snare. Resected and retrieved.                           - Redundant colon.                           - Internal hemorrhoids.                           - The examination was otherwise normal on direct                            and retroflexion views.                           After extensive lavage and mucosal visualization,                            the polyp removed is the most likely explanation                            for this patient's positive Cologuard.                            Nevertheless, with limited visualization due to                            poor preparation, repeat exam with better quality                            prep is warranted. Recommendation:           - Patient has a contact number available for                            emergencies. The signs and symptoms of potential                            delayed complications were discussed with the                            patient. Return to normal activities tomorrow.                            Written discharge instructions were provided to the                            patient.                           - Resume previous diet.                           - Resume Eliquis  (apixaban ) at prior dose tomorrow.                           - Await pathology results.                           -  Repeat colonoscopy within 3 months due to poor                            preparation. GoLytely prep for next exam. [name] L. Legrand, MD 04/03/2024 10:44:06  AM This report has been signed electronically.

## 2024-04-04 ENCOUNTER — Telehealth: Payer: Self-pay

## 2024-04-04 NOTE — Telephone Encounter (Signed)
  Follow up Call-     04/03/2024    9:24 AM  Call back number  Post procedure Call Back phone  # [phone]  Permission to leave phone message Yes     Patient questions:  Do you have a fever, pain , or abdominal swelling? No. Pain Score  0 *  Have you tolerated food without any problems? Yes.    Have you been able to return to your normal activities? Yes.    Do you have any questions about your discharge instructions: Diet   No. Medications  No. Follow up visit  No.  Do you have questions or concerns about your Care? No.  Actions: * If pain score is 4 or above: No action needed, pain <4.

## 2024-04-05 LAB — SURGICAL PATHOLOGY

## 2024-04-07 ENCOUNTER — Ambulatory Visit: Payer: Self-pay | Admitting: Gastroenterology

## 2024-04-19 ENCOUNTER — Other Ambulatory Visit: Payer: Self-pay | Admitting: Internal Medicine

## 2024-04-19 DIAGNOSIS — I4891 Unspecified atrial fibrillation: Secondary | ICD-10-CM

## 2024-04-19 NOTE — Telephone Encounter (Signed)
 Eliquis  5mg  refill request received. Patient is 73 years old, weight-93.9kg, Crea-1.1 on 01/25/24 via Care Everywhere from [facility], Diagnosis-Afib, and last seen by Dr. Mona on 02/05/24. Dose is appropriate based on dosing criteria. Will send in refill to requested pharmacy.

## 2024-05-03 ENCOUNTER — Other Ambulatory Visit: Payer: Self-pay | Admitting: Internal Medicine

## 2024-05-09 ENCOUNTER — Other Ambulatory Visit: Payer: Self-pay | Admitting: Internal Medicine

## 2024-05-13 ENCOUNTER — Encounter: Payer: Self-pay | Admitting: Radiology

## 2024-06-17 ENCOUNTER — Other Ambulatory Visit: Payer: Self-pay | Admitting: Internal Medicine

## 2024-06-17 DIAGNOSIS — E669 Obesity, unspecified: Secondary | ICD-10-CM

## 2024-07-31 ENCOUNTER — Other Ambulatory Visit: Payer: Self-pay | Admitting: Internal Medicine

## 2024-08-07 NOTE — Telephone Encounter (Signed)
 In accordance with refill protocols, please review and address the following requirements before this medication refill can be authorized:  Labs
